# Patient Record
Sex: Female | Born: 2000 | Race: White | Hispanic: No | Marital: Single | Smoking: Never smoker
Health system: Southern US, Community
[De-identification: ages and names within clinical notes are randomized; demographics above are authoritative.]

## PROBLEM LIST (undated history)

## (undated) DIAGNOSIS — N809 Endometriosis, unspecified: Secondary | ICD-10-CM

## (undated) HISTORY — DX: Endometriosis, unspecified: N80.9

## (undated) HISTORY — PX: NO PAST SURGERIES: SHX2092

---
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
## 2020-03-16 ENCOUNTER — Other Ambulatory Visit: Payer: Self-pay

## 2020-03-16 DIAGNOSIS — Z79899 Other long term (current) drug therapy: Secondary | ICD-10-CM | POA: Diagnosis not present

## 2020-03-16 DIAGNOSIS — H1013 Acute atopic conjunctivitis, bilateral: Secondary | ICD-10-CM | POA: Insufficient documentation

## 2020-03-16 DIAGNOSIS — H5789 Other specified disorders of eye and adnexa: Secondary | ICD-10-CM | POA: Diagnosis present

## 2020-03-16 NOTE — ED Triage Notes (Signed)
Reports woke this morning with right eye "matted" shut.  Reports bilateral eye itching.

## 2020-03-17 ENCOUNTER — Emergency Department
Admission: EM | Admit: 2020-03-17 | Discharge: 2020-03-17 | Disposition: A | Discharge status: Home / Self Care | Payer: No Typology Code available for payment source | Attending: Emergency Medicine | Admitting: Emergency Medicine

## 2020-03-17 DIAGNOSIS — H1013 Acute atopic conjunctivitis, bilateral: Secondary | ICD-10-CM

## 2020-03-17 MED ORDER — HYPROMELLOSE (GONIOSCOPIC) 2.5 % OP SOLN: 1.0000 [drp] | Freq: Four times a day (QID) | OPHTHALMIC | 12 refills | Status: DC | PRN

## 2020-03-17 NOTE — ED Provider Notes (Signed)
[facility] Emergency Department Provider Note  ____________________________________________  Time seen: Approximately 2:28 AM  I have reviewed the triage vital signs and the nursing notes.   HISTORY  Chief Complaint Eye Problem   HPI Madison Fernandez is a 19 y.o. female who presents  for evaluation of watery and itchy eyes.  Patient reports that this morning she woke up with some crusting her right eye which was closed shut.  She reports that she has been tearing up bilaterally with itching for the last 24 hours.  No eye pain, no changes in vision, no foreign body sensation.  She does not use contact lenses.  No past medical history on file.  Patient Active Problem List   Diagnosis Date Noted  . Dysmenorrhea 06/23/2018  . Right lower quadrant pain 06/23/2018  . Polycystic ovaries 06/23/2018  . Endometriosis 06/23/2018    Past Surgical History:  Procedure Laterality Date  . NO PAST SURGERIES      Prior to Admission medications   Medication Sig Start Date End Date Taking? Authorizing Provider  azithromycin (ZITHROMAX Z-PAK) 250 MG tablet Take 2 tablets (500 mg) on  Day 1,  followed by 1 tablet (250 mg) once daily on Days 2 through 5. 12/02/18   Laban Emperor, PA-C  Elagolix Sodium (ORILISSA) 150 MG TABS Take 150 mg by mouth daily. 07/14/18   Defrancesco, Alanda Slim, MD  hydroxypropyl methylcellulose / hypromellose (ISOPTO TEARS / GONIOVISC) 2.5 % ophthalmic solution Place 1 drop into both eyes 4 (four) times daily as needed for dry eyes. 03/17/20   Rudene Re, MD    Allergies Patient has no known allergies.  Family History  Problem Relation Age of Onset  . Ovarian cancer Neg Hx   . Colon cancer Neg Hx   . Breast cancer Neg Hx   . Diabetes Neg Hx     Social History Social History   Tobacco Use  . Smoking status: Never Smoker  . Smokeless tobacco: Never Used  Substance Use Topics  . Alcohol use: No  . Drug use: No    Review of  Systems  Constitutional: Negative for fever. Eyes: Negative for visual changes. + eye watery and itchy ENT: Negative for sore throat. Neck: No neck pain  Cardiovascular: Negative for chest pain. Respiratory: Negative for shortness of breath. Gastrointestinal: Negative for abdominal pain, vomiting or diarrhea. Genitourinary: Negative for dysuria. Musculoskeletal: Negative for back pain. Skin: Negative for rash. Neurological: Negative for headaches, weakness or numbness. Psych: No SI or HI  ____________________________________________   PHYSICAL EXAM:  VITAL SIGNS: ED Triage Vitals  Enc Vitals Group     BP 03/16/20 2231 118/71     Pulse Rate 03/16/20 2231 98     Resp 03/16/20 2231 18     Temp 03/16/20 2231 99 F (37.2 C)     Temp Source 03/16/20 2231 Oral     SpO2 03/16/20 2231 96 %     Weight 03/16/20 2231 133 lb (60.3 kg)     Height 03/16/20 2231 5' (1.524 m)     Head Circumference --      Peak Flow --      Pain Score 03/16/20 2239 0     Pain Loc --      Pain Edu? --      Excl. in [place]? --     Constitutional: Alert and oriented. Well appearing and in no apparent distress. HEENT:      Head: Normocephalic and atraumatic.  Eyes: Conjunctivae are normal. Sclera is non-icteric.  Pupils are equal round and reactive, normal extraocular movements, normal visual fields, no discharge noticed      Mouth/Throat: Mucous membranes are moist.       Neck: Supple with no signs of meningismus. Cardiovascular: Regular rate and rhythm.  Respiratory: Normal respiratory effort.  Neurologic: Normal speech and language. Face is symmetric. Moving all extremities. No gross focal neurologic deficits are appreciated. Skin: Skin is warm, dry and intact. No rash noted. Psychiatric: Mood and affect are normal. Speech and behavior are normal.  ____________________________________________   LABS (all labs ordered are listed, but only abnormal results are displayed)  Labs Reviewed - No  data to display ____________________________________________  EKG  none  ____________________________________________  RADIOLOGY  none  ____________________________________________   PROCEDURES  Procedure(s) performed: None Procedures Critical Care performed:  None ____________________________________________   INITIAL IMPRESSION / ASSESSMENT AND PLAN / ED COURSE  19 y.o. female who presents  for evaluation of watery and itchy eyes bilaterally.  Patient is completely normal exam with normal conjunctiva, normal pupillary reflex, no obvious discharge seen, no cellulitis.  Eyes were washed with normal saline.  Patient discharged home on artificial tears with follow-up with pediatrician.  Discussed my return precautions for purulent discharge, changes in vision, eye pain, foreign body sensation, or injected conjunctiva.  Old medical records reviewed    _____________________________________________ Please note:  Patient was evaluated in Emergency Department today for the symptoms described in the history of present illness. Patient was evaluated in the context of the global COVID-19 pandemic, which necessitated consideration that the patient might be at risk for infection with the SARS-CoV-2 virus that causes COVID-19. Institutional protocols and algorithms that pertain to the evaluation of patients at risk for COVID-19 are in a state of rapid change based on information released by regulatory bodies including the CDC and federal and state organizations. These policies and algorithms were followed during the patient's care in the ED.  Some ED evaluations and interventions may be delayed as a result of limited staffing during the pandemic.   [place] Controlled Substance Database was reviewed by me. ____________________________________________   FINAL CLINICAL IMPRESSION(S) / ED DIAGNOSES   Final diagnoses:  Allergic conjunctivitis of both eyes      NEW MEDICATIONS STARTED DURING  THIS VISIT:  ED Discharge Orders         Ordered    hydroxypropyl methylcellulose / hypromellose (ISOPTO TEARS / GONIOVISC) 2.5 % ophthalmic solution  4 times daily PRN     03/17/20 0228           Note:  This document was prepared using Dragon voice recognition software and may include unintentional dictation errors.    Don Perking, [place], MD 03/17/20 [phone]

## 2020-08-12 ENCOUNTER — Other Ambulatory Visit: Payer: Self-pay

## 2020-08-12 ENCOUNTER — Ambulatory Visit (INDEPENDENT_AMBULATORY_CARE_PROVIDER_SITE_OTHER): Payer: No Typology Code available for payment source | Admitting: Certified Nurse Midwife

## 2020-08-12 ENCOUNTER — Encounter: Payer: Self-pay | Admitting: Certified Nurse Midwife

## 2020-08-12 VITALS — BP 95/58 | HR 91 | Ht 59.0 in | Wt 138.6 lb

## 2020-08-12 DIAGNOSIS — N941 Unspecified dyspareunia: Secondary | ICD-10-CM

## 2020-08-12 DIAGNOSIS — Z79899 Other long term (current) drug therapy: Secondary | ICD-10-CM

## 2020-08-12 NOTE — Patient Instructions (Signed)
Endometriosis  Endometriosis is a condition in which the tissue that lines the uterus (endometrium) grows outside of its normal location. The tissue may grow in many locations close to the uterus, but it commonly grows on the ovaries, fallopian tubes, vagina, or bowel. When the uterus sheds the endometrium every menstrual cycle, there is bleeding wherever the endometrial tissue is located. This can cause pain because blood is irritating to tissues that are not normally exposed to it. What are the causes? The cause of endometriosis is not known. What increases the risk? You may be more likely to develop endometriosis if you:  Have a family history of endometriosis.  Have never given birth.  Started your period at age 9 or younger.  Have high levels of estrogen in your body.  Were exposed to a certain medicine (diethylstilbestrol) before you were born (in utero).  Had low birth weight.  Were born as a twin, triplet, or other multiple.  Have a BMI of less than 25. BMI is an estimate of body fat and is calculated from height and weight. What are the signs or symptoms? Often, there are no symptoms of this condition. If you do have symptoms, they may:  Vary depending on where your endometrial tissue is growing.  Occur during your menstrual period (most common) or midcycle.  Come and go, or you may go months with no symptoms at all.  Stop with menopause. Symptoms may include:  Pain in the back or abdomen.  Heavier bleeding during periods.  Pain during sex.  Painful bowel movements.  Infertility.  Pelvic pain.  Bleeding more than once a month. How is this diagnosed? This condition is diagnosed based on your symptoms and a physical exam. You may have tests, such as:  Blood tests and urine tests. These may be done to help rule out other possible causes of your symptoms.  Ultrasound, to look for abnormal tissues.  An X-ray of the lower bowel (barium enema).  An  ultrasound that is done through the vagina (transvaginally).  CT scan.  MRI.  Laparoscopy. In this procedure, a lighted, pencil-sized instrument called a laparoscope is inserted into your abdomen through an incision. The laparoscope allows your health care provider to look at the organs inside your body and check for abnormal tissue to confirm the diagnosis. If abnormal tissue is found, your health care provider may remove a small piece of tissue (biopsy) to be examined under a microscope. How is this treated? Treatment for this condition may include:  Medicines to relieve pain, such as NSAIDs.  Hormone therapy. This involves using artificial (synthetic) hormones to reduce endometrial tissue growth. Your health care provider may recommend using a hormonal form of birth control, or other medicines.  Surgery. This may be done to remove abnormal endometrial tissue. ? In some cases, tissue may be removed using a laparoscope and a laser (laparoscopic laser treatment). ? In severe cases, surgery may be done to remove the fallopian tubes, uterus, and ovaries (hysterectomy). Follow these instructions at home:  Take over-the-counter and prescription medicines only as told by your health care provider.  Do not drive or use heavy machinery while taking prescription pain medicine.  Try to avoid activities that cause pain, including sexual activity.  Keep all follow-up visits as told by your health care provider. This is important. Contact a health care provider if:  You have pain in the area between your hip bones (pelvic area) that occurs: ? Before, during, or after your period. ?   In between your period and gets worse during your period. ? During or after sex. ? With bowel movements or urination, especially during your period.  You have problems getting pregnant.  You have a fever. Get help right away if:  You have severe pain that does not get better with medicine.  You have severe  nausea and vomiting, or you cannot eat without vomiting.  You have pain that affects only the lower, right side of your abdomen.  You have abdominal pain that gets worse.  You have abdominal swelling.  You have blood in your stool. This information is not intended to replace advice given to you by your health care provider. Make sure you discuss any questions you have with your health care provider. Document Revised: 09/24/2017 Document Reviewed: 03/14/2016 Elsevier Patient Education  2020 [employer] Inc.  

## 2020-08-12 NOTE — Progress Notes (Signed)
GYN ENCOUNTER NOTE  Subjective:       Madison Fernandez is a 19 y.o. G0P0000 female is here for gynecologic evaluation of the following issues:  1.  Endometriosis follow up, pt state she was was perscribed Pollie Friar for endometriosis and that she is here for a refill. .     Gynecologic History Patient's last menstrual period was 07/12/2020 (exact date). Contraception: Nexplanon, expidred Last Pap: n/a . Last mammogram: n/a  Obstetric History OB History  Gravida Para Term Preterm AB Living  0 0 0 0 0 0  SAB TAB Ectopic Multiple Live Births  0 0 0 0 0    Past Medical History:  Diagnosis Date  . Endometriosis     Past Surgical History:  Procedure Laterality Date  . NO PAST SURGERIES      Current Outpatient Medications on File Prior to Visit  Medication Sig Dispense Refill  . sertraline (ZOLOFT) 50 MG tablet Take 50 mg by mouth daily.    . Elagolix Sodium (ORILISSA) 150 MG TABS Take 150 mg by mouth daily. (Patient not taking: Reported on 08/12/2020) 30 tablet 6   No current facility-administered medications on file prior to visit.    No Known Allergies  Social History   Socioeconomic History  . Marital status: Single    Spouse name: Not on file  . Number of children: Not on file  . Years of education: Not on file  . Highest education level: Not on file  Occupational History  . Not on file  Tobacco Use  . Smoking status: Never Smoker  . Smokeless tobacco: Never Used  Vaping Use  . Vaping Use: Never used  Substance and Sexual Activity  . Alcohol use: No  . Drug use: No  . Sexual activity: Not Currently    Birth control/protection: Implant  Other Topics Concern  . Not on file  Social History Narrative  . Not on file   Social Determinants of Health   Financial Resource Strain:   . Difficulty of Paying Living Expenses: Not on file  Food Insecurity:   . Worried About Programme researcher, broadcasting/film/video in the Last Year: Not on file  . Ran Out of Food in the Last Year:  Not on file  Transportation Needs:   . Lack of Transportation (Medical): Not on file  . Lack of Transportation (Non-Medical): Not on file  Physical Activity:   . Days of Exercise per Week: Not on file  . Minutes of Exercise per Session: Not on file  Stress:   . Feeling of Stress : Not on file  Social Connections:   . Frequency of Communication with Friends and Family: Not on file  . Frequency of Social Gatherings with Friends and Family: Not on file  . Attends Religious Services: Not on file  . Active Member of Clubs or Organizations: Not on file  . Attends Banker Meetings: Not on file  . Marital Status: Not on file  Intimate Partner Violence:   . Fear of Current or Ex-Partner: Not on file  . Emotionally Abused: Not on file  . Physically Abused: Not on file  . Sexually Abused: Not on file    Family History  Problem Relation Age of Onset  . Ovarian cancer Neg Hx   . Colon cancer Neg Hx   . Breast cancer Neg Hx   . Diabetes Neg Hx     The following portions of the patient's history were reviewed and updated as appropriate: allergies,  current medications, past family history, past medical history, past social history, past surgical history and problem list.  Review of Systems Review of Systems - Negative except as mentioned in HPI Review of Systems - General ROS: negative for - chills, fatigue, fever, hot flashes, malaise or night sweats Hematological and Lymphatic ROS: negative for - bleeding problems or swollen lymph nodes Gastrointestinal ROS: negative for - abdominal pain, blood in stools, change in bowel habits and nausea/vomiting Musculoskeletal ROS: negative for - joint pain, muscle pain or muscular weakness Genito-Urinary ROS: negative for - change in menstrual cycle, dysmenorrhea, dyspareunia, dysuria, genital discharge, genital ulcers, hematuria, incontinence, irregular/heavy menses, nocturia or pelvic pain  Objective:   BP (!) 95/58   Pulse 91   Ht  4\' 11"  (1.499 m)   Wt 138 lb 9 oz (62.9 kg)   LMP 07/12/2020 (Exact Date)   BMI 27.99 kg/m  CONSTITUTIONAL: Well-developed, well-nourished female in no acute distress.  HENT:  Normocephalic, atraumatic.  NECK: Normal range of motion, supple, no masses.  Normal thyroid.  SKIN: Skin is warm and dry. No rash noted. Not diaphoretic. No erythema. No pallor. NEUROLGIC: Alert and oriented to person, place, and time. PSYCHIATRIC: Normal mood and affect. Normal behavior. Normal judgment and thought content. CARDIOVASCULAR:Not Examined RESPIRATORY: Not Examined BREASTS: Not Examined ABDOMEN: Soft, non distended; Non tender.  No Organomegaly. PELVIC:not indicated for medication refill.  MUSCULOSKELETAL: Normal range of motion. No tenderness.  No cyanosis, clubbing, or edema.     Assessment:   Endometriosis Medication management     Plan:   Discussed 07/14/2020 intended use for endometriosis : Maximum treatment duration: 24 months. Pt verbalizes understanding. Discussed use of BC and motrin to manage pian . She verbalizes and agrees to plan. She will return to clinic for removal of expired nexplnon and counseling for new birth control method. She agrees to plan  Of care.   Face to face time 15  Pollie Friar, CNM

## 2020-08-27 ENCOUNTER — Ambulatory Visit: Payer: Self-pay | Admitting: Certified Nurse Midwife

## 2020-10-26 DIAGNOSIS — R7303 Prediabetes: Secondary | ICD-10-CM

## 2020-10-26 HISTORY — DX: Prediabetes: R73.03

## 2020-11-25 ENCOUNTER — Other Ambulatory Visit: Payer: Self-pay

## 2020-11-25 ENCOUNTER — Emergency Department
Admission: EM | Admit: 2020-11-25 | Discharge: 2020-11-25 | Disposition: A | Discharge status: Home / Self Care | Payer: Self-pay | Attending: Emergency Medicine | Admitting: Emergency Medicine

## 2020-11-25 ENCOUNTER — Emergency Department: Payer: Self-pay

## 2020-11-25 DIAGNOSIS — R110 Nausea: Secondary | ICD-10-CM | POA: Insufficient documentation

## 2020-11-25 DIAGNOSIS — N390 Urinary tract infection, site not specified: Secondary | ICD-10-CM | POA: Insufficient documentation

## 2020-11-25 DIAGNOSIS — B9689 Other specified bacterial agents as the cause of diseases classified elsewhere: Secondary | ICD-10-CM | POA: Insufficient documentation

## 2020-11-25 DIAGNOSIS — R1031 Right lower quadrant pain: Secondary | ICD-10-CM | POA: Insufficient documentation

## 2020-11-25 LAB — CBC
HCT: 42.4 % (ref 36.0–46.0)
Hemoglobin: 14 g/dL (ref 12.0–15.0)
MCH: 29.2 pg (ref 26.0–34.0)
MCHC: 33 g/dL (ref 30.0–36.0)
MCV: 88.5 fL (ref 80.0–100.0)
Platelets: 328 10*3/uL (ref 150–400)
RBC: 4.79 MIL/uL (ref 3.87–5.11)
RDW: 12.2 % (ref 11.5–15.5)
WBC: 13.7 10*3/uL — ABNORMAL HIGH (ref 4.0–10.5)
nRBC: 0 % (ref 0.0–0.2)

## 2020-11-25 LAB — URINALYSIS, COMPLETE (UACMP) WITH MICROSCOPIC
Bilirubin Urine: NEGATIVE
Glucose, UA: 50 mg/dL — AB
Ketones, ur: NEGATIVE mg/dL
Nitrite: NEGATIVE
Protein, ur: 100 mg/dL — AB
RBC / HPF: >50 RBC/hpf — ABNORMAL HIGH (ref 0–5)
Specific Gravity, Urine: 1.032 — ABNORMAL HIGH (ref 1.005–1.030)
pH: 5 (ref 5.0–8.0)

## 2020-11-25 LAB — BASIC METABOLIC PANEL
Anion gap: 12 (ref 5–15)
BUN: 12 mg/dL (ref 6–20)
CO2: 21 mmol/L — ABNORMAL LOW (ref 22–32)
Calcium: 9.5 mg/dL (ref 8.9–10.3)
Chloride: 108 mmol/L (ref 98–111)
Creatinine, Ser: 0.7 mg/dL (ref 0.44–1.00)
GFR, Estimated: >60 mL/min (ref >60)
Glucose, Bld: 123 mg/dL — ABNORMAL HIGH (ref 70–99)
Potassium: 3.8 mmol/L (ref 3.5–5.1)
Sodium: 141 mmol/L (ref 135–145)

## 2020-11-25 LAB — WET PREP, GENITAL
Clue Cells Wet Prep HPF POC: NONE SEEN
Sperm: NONE SEEN
Trich, Wet Prep: NONE SEEN
Yeast Wet Prep HPF POC: NONE SEEN

## 2020-11-25 LAB — PREGNANCY, URINE: Preg Test, Ur: NEGATIVE

## 2020-11-25 MED ORDER — NAPROXEN 500 MG PO TABS: 500.0000 mg | ORAL_TABLET | Freq: Two times a day (BID) | ORAL | Status: DC

## 2020-11-25 MED ORDER — PHENAZOPYRIDINE HCL 200 MG PO TABS: 200.0000 mg | ORAL_TABLET | Freq: Three times a day (TID) | ORAL | 0 refills | Status: DC | PRN

## 2020-11-25 MED ORDER — SULFAMETHOXAZOLE-TRIMETHOPRIM 800-160 MG PO TABS: 1.0000 | ORAL_TABLET | Freq: Two times a day (BID) | ORAL | 0 refills | Status: DC

## 2020-11-25 NOTE — ED Triage Notes (Signed)
Pt states coming in for pain that started "in my private area but is now in my back and my abdomen." Pt states that she had 1 episode of vomiting. Pt states pain is on the right side. Pt states some burning with urination.

## 2020-11-25 NOTE — ED Notes (Signed)
Patient transported to CT 

## 2020-11-25 NOTE — ED Notes (Signed)
Verified with provider that we did not need an EKG

## 2020-11-25 NOTE — ED Provider Notes (Signed)
[facility] Emergency Department Provider Note   ____________________________________________   Event Date/Time   First MD Initiated Contact with Patient 11/25/20 [phone]     (approximate)  I have reviewed the triage vital signs and the nursing notes.   HISTORY  Chief Complaint Flank Pain (Flank/back pain)    HPI Madison Fernandez is a 20 y.o. female patient presents with 3 days of vaginal discharge. Patient also complaining of nausea,, dysuria and right flank pain. Patient denies fever with complaint. Patient last sexual contact was greater than 4 months ago. Patient stated abnormal periods secondary to birth control implants. Patient rates pain as a 10/10. Patient described the pain as "burning/aching". No palliative measure for complaint. History endometriosis and polycystic ovaries.      Past Medical History:  Diagnosis Date  . Endometriosis     Patient Active Problem List   Diagnosis Date Noted  . Dyspareunia, female 08/12/2020  . Dysmenorrhea 06/23/2018  . Polycystic ovaries 06/23/2018  . Endometriosis 06/23/2018    Past Surgical History:  Procedure Laterality Date  . NO PAST SURGERIES      Prior to Admission medications   Medication Sig Start Date End Date Taking? Authorizing Provider  naproxen (NAPROSYN) 500 MG tablet Take 1 tablet (500 mg total) by mouth 2 (two) times daily with a meal. 11/25/20  Yes Joni Reining, PA-C  phenazopyridine (PYRIDIUM) 200 MG tablet Take 1 tablet (200 mg total) by mouth 3 (three) times daily as needed for pain. 11/25/20  Yes Joni Reining, PA-C  sulfamethoxazole-trimethoprim (BACTRIM DS) 800-160 MG tablet Take 1 tablet by mouth 2 (two) times daily. 11/25/20  Yes Joni Reining, PA-C  Elagolix Sodium (ORILISSA) 150 MG TABS Take 150 mg by mouth daily. Patient not taking: Reported on 08/12/2020 07/14/18   Defrancesco, Prentice Docker, MD  sertraline (ZOLOFT) 50 MG tablet Take 50 mg by mouth daily. 05/10/20    [provider]    Allergies Patient has no known allergies.  Family History  Problem Relation Age of Onset  . Ovarian cancer Neg Hx   . Colon cancer Neg Hx   . Breast cancer Neg Hx   . Diabetes Neg Hx     Social History Social History   Tobacco Use  . Smoking status: Never Smoker  . Smokeless tobacco: Never Used  Vaping Use  . Vaping Use: Never used  Substance Use Topics  . Alcohol use: No  . Drug use: No    Review of Systems Constitutional: No fever/chills Eyes: No visual changes. ENT: No sore throat. Cardiovascular: Denies chest pain. Respiratory: Denies shortness of breath. Gastrointestinal: No abdominal pain.  No nausea, no vomiting.  No diarrhea.  No constipation. Genitourinary: Positive for dysuria. Musculoskeletal: Negative for back pain. Right flank pain. Skin: Negative for rash. Neurological: Negative for headaches, focal weakness or numbness.   ____________________________________________   PHYSICAL EXAM:  VITAL SIGNS: ED Triage Vitals  Enc Vitals Group     BP 11/25/20 0125 106/90     Pulse Rate 11/25/20 0125 82     Resp 11/25/20 0125 (!) 22     Temp 11/25/20 0125 97.9 F (36.6 C)     Temp Source 11/25/20 0250 Oral     SpO2 11/25/20 0125 96 %     Weight 11/25/20 0126 140 lb (63.5 kg)     Height 11/25/20 0126 4\' 11"  (1.499 m)     Head Circumference --      Peak Flow --  Pain Score 11/25/20 0125 10     Pain Loc --      Pain Edu? --      Excl. in GC? --    Constitutional: Alert and oriented. Well appearing and in no acute distress. Cardiovascular: Normal rate, regular rhythm. Grossly normal heart sounds.  Good peripheral circulation. Respiratory: Normal respiratory effort.  No retractions. Lungs CTAB. Gastrointestinal: Soft and nontender. No distention. No abdominal bruits. Right CVA tenderness. Genitourinary: Chaperoned pelvic exam reveals no acute findings.  Culture is pending. Neurologic:  Normal speech and language. No  gross focal neurologic deficits are appreciated. No gait instability. Skin:  Skin is warm, dry and intact. No rash noted. Psychiatric: Mood and affect are normal. Speech and behavior are normal.  ____________________________________________   LABS (all labs ordered are listed, but only abnormal results are displayed)  Labs Reviewed  WET PREP, GENITAL - Abnormal; Notable for the following components:      Result Value   WBC, Wet Prep HPF POC FEW (*)    All other components within normal limits  URINALYSIS, COMPLETE (UACMP) WITH MICROSCOPIC - Abnormal; Notable for the following components:   Color, Urine YELLOW (*)    APPearance CLOUDY (*)    Specific Gravity, Urine 1.032 (*)    Glucose, UA 50 (*)    Hgb urine dipstick LARGE (*)    Protein, ur 100 (*)    Leukocytes,Ua TRACE (*)    RBC / HPF >50 (*)    Bacteria, UA RARE (*)    All other components within normal limits  BASIC METABOLIC PANEL - Abnormal; Notable for the following components:   CO2 21 (*)    Glucose, Bld 123 (*)    All other components within normal limits  CBC - Abnormal; Notable for the following components:   WBC 13.7 (*)    All other components within normal limits  URINE CULTURE  PREGNANCY, URINE  POC URINE PREG, ED   ____________________________________________  EKG   ____________________________________________  RADIOLOGY I, Joni Reining, personally viewed and evaluated these images (plain radiographs) as part of my medical decision making, as well as reviewing the written report by the radiologist.  ED MD interpretation:    Official radiology report(s): CT Renal Stone Study  Result Date: 11/25/2020 CLINICAL DATA:  Back and abdominal pain, vomiting, right-sided, burning with urination EXAM: CT ABDOMEN AND PELVIS WITHOUT CONTRAST TECHNIQUE: Multidetector CT imaging of the abdomen and pelvis was performed following the standard protocol without IV contrast. COMPARISON:  06/22/2018 FINDINGS: Lower  chest: No acute abnormality. Hepatobiliary: No solid liver abnormality is seen. No gallstones, gallbladder wall thickening, or biliary dilatation. Pancreas: Unremarkable. No pancreatic ductal dilatation or surrounding inflammatory changes. Spleen: Normal in size without significant abnormality. Adrenals/Urinary Tract: Adrenal glands are unremarkable. Kidneys are normal, without renal calculi, solid lesion, or hydronephrosis. Bladder is unremarkable. Incidental note of a small urachal remnant. Stomach/Bowel: Stomach is within normal limits. Appendix appears normal. No evidence of bowel wall thickening, distention, or inflammatory changes. Vascular/Lymphatic: No significant vascular findings are present. No enlarged abdominal or pelvic lymph nodes. Reproductive: No mass or other significant abnormality. Other: No abdominal wall hernia or abnormality. No abdominopelvic ascites. Musculoskeletal: No acute or significant osseous findings. IMPRESSION: No non-contrast CT findings of the abdomen or pelvis to explain pain, nausea, or vomiting. No evidence of urinary tract calculus or hydronephrosis. Electronically Signed   By: Lauralyn Primes M.D.   On: 11/25/2020 10:32    ____________________________________________   PROCEDURES  Procedure(s)  performed (including Critical Care):  Procedures   ____________________________________________   INITIAL IMPRESSION / ASSESSMENT AND PLAN / ED COURSE  As part of my medical decision making, I reviewed the following data within the electronic MEDICAL RECORD NUMBER         Patient complaint right flank and right lower abdominal pain for 2 days.  Patient states dysuria.  Discussed patient lab results.  Discussed no acute findings on CT.  Patient complaining physical exam consistent with UTI.  Culture is pending.  Patient given discharge care instruction advised take medication as directed.  Patient [facility]  Return to ED if condition  worsens.      ____________________________________________   FINAL CLINICAL IMPRESSION(S) / ED DIAGNOSES  Final diagnoses:  Lower urinary tract infectious disease     ED Discharge Orders         Ordered    sulfamethoxazole-trimethoprim (BACTRIM DS) 800-160 MG tablet  2 times daily        11/25/20 1119    phenazopyridine (PYRIDIUM) 200 MG tablet  3 times daily PRN        11/25/20 1119    naproxen (NAPROSYN) 500 MG tablet  2 times daily with meals        11/25/20 1119          *Please note:  Madison Fernandez was evaluated in Emergency Department on 11/25/2020 for the symptoms described in the history of present illness. She was evaluated in the context of the global COVID-19 pandemic, which necessitated consideration that the patient might be at risk for infection with the SARS-CoV-2 virus that causes COVID-19. Institutional protocols and algorithms that pertain to the evaluation of patients at risk for COVID-19 are in a state of rapid change based on information released by regulatory bodies including the CDC and federal and state organizations. These policies and algorithms were followed during the patient's care in the ED.  Some ED evaluations and interventions may be delayed as a result of limited staffing during and the pandemic.*   Note:  This document was prepared using Dragon voice recognition software and may include unintentional dictation errors.    Joni Reining, PA-C 11/25/20 1124    Phineas Semen, MD 11/29/20 [phone]

## 2020-11-26 LAB — URINE CULTURE
Culture: <10000 — AB
Special Requests: NORMAL

## 2021-02-20 ENCOUNTER — Other Ambulatory Visit: Payer: Self-pay

## 2021-02-20 ENCOUNTER — Ambulatory Visit: Payer: Medicaid Other | Admitting: Gerontology

## 2021-02-20 ENCOUNTER — Other Ambulatory Visit: Payer: Medicaid Other

## 2021-02-20 VITALS — BP 109/73 | HR 86 | Temp 98.0°F | Ht 59.0 in | Wt 144.8 lb

## 2021-02-20 DIAGNOSIS — Z7689 Persons encountering health services in other specified circumstances: Secondary | ICD-10-CM

## 2021-02-20 NOTE — Patient Instructions (Signed)
https://www.nimh.nih.gov/health/publications/depression-what-you-need-to-know/index.shtml">  Depression Screening Depression screening is a tool that your health care provider can use to learn if you have symptoms of depression. Depression is a common condition with many symptoms that are also often found in other conditions. Depression is treatable, but it must first be diagnosed. You may not know that certain feelings, thoughts, and behaviors that you are having can be symptoms of depression. Taking a depression screening test can help you and your health care provider decide if you need more assessment, or if you should be referred to a mental health care provider. What are the screening tests?  You may have a physical exam to see if another condition is affecting your mental health. You may have a blood or urine sample taken during the physical exam.  You may be interviewed using a screening tool that was developed from research, such as one of these: ? Patient Health Questionnaire (PHQ). This is a set of either 2 or 9 questions. A health care provider who has been trained to score this screening test uses a guide to assess if your symptoms suggest that you may have depression. ? Hamilton Depression Rating Scale (HAM-D). This is a set of either 17 or 24 questions. You may be asked to take it again during or after your treatment, to see if your depression has gotten better. ? Beck Depression Inventory (BDI). This is a set of 21 multiple choice questions. Your health care provider scores your answers to assess:  Your level of depression, ranging from mild to severe.  Your response to treatment.  Your health care provider may talk with you about your daily activities, such as eating, sleeping, work, and recreation, and ask if you have had any changes in activity.  Your health care provider may ask you to see a mental health specialist, such as a psychiatrist or psychologist, for more  evaluation. Who should be screened for depression?  All adults, including adults with a family history of a mental health disorder.  Adolescents who are 12-18 years old.  People who are recovering from a myocardial infarction (MI).  Pregnant women, or women who have given birth.  People who have a long-term (chronic) illness.  Anyone who has been diagnosed with another type of a mental health disorder.  Anyone who has symptoms that could show depression.   What do my results mean? Your health care provider will review the results of your depression screening, physical exam, and lab tests. Positive screens suggest that you may have depression. Screening is the first step in getting the care that you may need. It is up to you to get your screening results. Ask your health care provider, or the department that is doing your screening tests, when your results will be ready. Talk with your health care provider about your results and diagnosis. A diagnosis of depression is made using the Diagnostic and Statistical Manual of Mental Disorders (DSM-V). This is a book that lists the number and type of symptoms that must be present for a health care provider to give a specific diagnosis.  Your health care provider may work with you to treat your symptoms of depression, or your health care provider may help you find a mental health provider who can assess, diagnose, and treat your depression. Get help right away if:  You have thoughts about hurting yourself or others. If you ever feel like you may hurt yourself or others, or have thoughts about taking your own life, get help   right away. You can go to your nearest emergency department or call:  Your local emergency services (911 in the [place]  A suicide crisis helpline, such as the National Suicide Prevention Lifeline at 1-800-273-8255. This is open 24 hours a day. Summary  Depression screening is the first step in getting the help that you may  need.  If your screening test shows symptoms of depression (is positive), your health care provider may ask you to see a mental health provider.  Anyone who is age 12 or older should be screened for depression. This information is not intended to replace advice given to you by your health care provider. Make sure you discuss any questions you have with your health care provider. Document Revised: 04/04/2020 Document Reviewed: 04/04/2020 Elsevier Patient Education  2021 Elsevier Inc.  

## 2021-02-20 NOTE — Progress Notes (Signed)
[facility]   Progress Note: General Provider: Wolfgang Phoenix, NP  SUBJECTIVE:   Madison Fernandez is a 20 y.o. female who  has a past medical history of Endometriosis, Endometriosis (01/2018), and Ovarian cyst (01/2018).. The patient presents to establish care with this clinic. She reports that she has a Nexplanon birth control implant in her left upper arm inserted in 2017 and was due to be removed in 2020; however, she hasn't gone back to the [facility] to replace it. She states that she will make an appointment tomorrow for  Nexplanon removal. She started her period this week and reports that she has not been sexually active for over a year. She reports that she was prescribed Zoloft for depression by [facility] pediatrics about a year ago, but she only took it for three months. She hasn't taken Zoloft in the last eight months and denies changes in sleep or appetite feeling sad hopeless, suicidal or homicidal ideation. Currently, she is not taking any prescription medications. She goes to the gym 2-3 times a week and eats a healthy diet. She never smoked and hasn't had an alcoholic drink in one year. She had her covid 19 primary vaccination series but hasn't received a booster shot. Overall she is going well and offers no further complaints. Review of Systems  Constitutional: Negative.   HENT: Negative.   Eyes: Negative.   Respiratory: Negative.   Cardiovascular: Negative.   Gastrointestinal: Negative.   Genitourinary: Negative.   Musculoskeletal: Negative.   Skin: Negative.   Neurological: Negative.   Endo/Heme/Allergies: Negative.   Psychiatric/Behavioral: Negative.      OBJECTIVE: BP 109/73 (BP Location: Left Arm, Patient Position: Sitting, Cuff Size: Normal)   Pulse 86   Temp 98 F (36.7 C)   Ht 4' 11"  (1.499 m)   Wt 144 lb 12.8 oz (65.7 kg)   SpO2 96%   BMI 29.25 kg/m   Wt Readings from Last 3 Encounters:  02/20/21 144 lb 12.8  oz (65.7 kg)  11/25/20 140 lb (63.5 kg)  08/12/20 138 lb 9 oz (62.9 kg)     Physical Exam Vitals reviewed.  HENT:     Head: Normocephalic.  Eyes:     Pupils: Pupils are equal, round, and reactive to light.  Cardiovascular:     Rate and Rhythm: Normal rate and regular rhythm.     Pulses: Normal pulses.     Heart sounds: Normal heart sounds.  Pulmonary:     Effort: Pulmonary effort is normal.     Breath sounds: Normal breath sounds.  Abdominal:     General: Bowel sounds are normal.     Palpations: Abdomen is soft.  Musculoskeletal:        General: Normal range of motion.     Cervical back: Normal range of motion.  Skin:    General: Skin is warm and dry.     Capillary Refill: Capillary refill takes less than 2 seconds.  Neurological:     Mental Status: She is alert and oriented to person, place, and time.  Psychiatric:        Mood and Affect: Mood normal.        Behavior: Behavior normal.        Thought Content: Thought content normal.        Judgment: Judgment normal.     ASSESSMENT/PLAN:   1. Encounter to establish care - Advised to return to [facility] for birth control options. -  CBC - Comp Met (CMET) - Lipid Panel With LDL/HDL Ratio - TSH - HgB A1c   Return in about 2 weeks (around 03/06/2021), or if symptoms worsen or fail to improve, for lab review. .    The patient was given clear instructions to go to ER or return to medical center if symptoms do not improve, worsen or new problems develop. The patient verbalized understanding and agreed with plan of care.  Anjanae Woehrle, [place]

## 2021-02-21 LAB — LIPID PANEL WITH LDL/HDL RATIO
Cholesterol, Total: 166 mg/dL (ref 100–199)
HDL: 50 mg/dL (ref >39)
LDL Chol Calc (NIH): 97 mg/dL (ref 0–99)
LDL/HDL Ratio: 1.9 ratio (ref 0.0–3.2)
Triglycerides: 106 mg/dL (ref 0–149)
VLDL Cholesterol Cal: 19 mg/dL (ref 5–40)

## 2021-02-21 LAB — COMPREHENSIVE METABOLIC PANEL
ALT: 68 IU/L — ABNORMAL HIGH (ref 0–32)
AST: 37 IU/L (ref 0–40)
Albumin/Globulin Ratio: 1.8 (ref 1.2–2.2)
Albumin: 4.8 g/dL (ref 3.9–5.0)
Alkaline Phosphatase: 123 IU/L — ABNORMAL HIGH (ref 44–121)
BUN/Creatinine Ratio: 9 (ref 9–23)
BUN: 6 mg/dL (ref 6–20)
Bilirubin Total: 0.4 mg/dL (ref 0.0–1.2)
CO2: 20 mmol/L (ref 20–29)
Calcium: 9.8 mg/dL (ref 8.7–10.2)
Chloride: 104 mmol/L (ref 96–106)
Creatinine, Ser: 0.64 mg/dL (ref 0.57–1.00)
Globulin, Total: 2.7 g/dL (ref 1.5–4.5)
Glucose: 88 mg/dL (ref 65–99)
Potassium: 4.6 mmol/L (ref 3.5–5.2)
Sodium: 144 mmol/L (ref 134–144)
Total Protein: 7.5 g/dL (ref 6.0–8.5)
eGFR: 129 mL/min/{1.73_m2} (ref >59)

## 2021-02-21 LAB — HEMOGLOBIN A1C
Est. average glucose Bld gHb Est-mCnc: 126 mg/dL
Hgb A1c MFr Bld: 6 % — ABNORMAL HIGH (ref 4.8–5.6)

## 2021-02-21 LAB — TSH: TSH: 1.04 u[IU]/mL (ref 0.450–4.500)

## 2021-03-06 ENCOUNTER — Other Ambulatory Visit: Payer: Self-pay

## 2021-03-06 ENCOUNTER — Ambulatory Visit: Payer: Medicaid Other | Admitting: Gerontology

## 2021-03-06 VITALS — BP 105/71 | HR 106 | Temp 98.1°F | Ht 59.0 in | Wt 146.4 lb

## 2021-03-06 DIAGNOSIS — R748 Abnormal levels of other serum enzymes: Secondary | ICD-10-CM

## 2021-03-06 DIAGNOSIS — R059 Cough, unspecified: Secondary | ICD-10-CM

## 2021-03-06 DIAGNOSIS — R7303 Prediabetes: Secondary | ICD-10-CM

## 2021-03-06 MED ORDER — METFORMIN HCL 1000 MG PO TABS
500.0000 mg | ORAL_TABLET | Freq: Every day | ORAL | 0 refills | Status: DC
  Filled 2021-03-06: qty 15, 30d supply, fill #0
  Filled 2021-04-10: qty 15, 30d supply, fill #1

## 2021-03-06 MED ORDER — BENZONATATE 100 MG PO CAPS
100.0000 mg | ORAL_CAPSULE | Freq: Three times a day (TID) | ORAL | 0 refills | Status: DC | PRN
  Filled 2021-03-06: qty 20, 7d supply, fill #0

## 2021-03-06 NOTE — Progress Notes (Signed)
Established Patient Office Visit  Subjective:  Patient ID: Madison Fernandez, female    DOB: 07/05/2001  Age: 20 y.o. MRN: [id number]  CC:  Chief Complaint  Patient presents with  . Follow-up    2 week check up; sick in Jan, has had a cough ever since then that is worse at night. Vomitting last night; wants to discuss anxiety medications because she ran out in 2021; thinks she has IBS    HPI Madison Fernandez is a 20 y.o. female who  has a past medical history of Endometriosis (01/2018), and Ovarian cyst (01/2018),presents for lab review. Her HgbA1c done on 02/20/21 was 6%, she reports having a family history of type 2 diabetes. Her Alkaline Phosphatase was slightly elevated and ALT was 68. She denies alcohol consumption, denies jaundice, dark urine and right upper quadrant pain. She c/o chronic dry cough that has been going on since January. She states that cough wakes her up at night, denies shortness of breath, wheezing, chest tightness and rhinorrhea. She also reports that she is yet to follow up with the health department for the removal of left upper arm Nexplanon. She states that her mood is good, denies suicidal nor homicidal ideation. Overall, she states that she's doing well and offers no further complaint.                             Past Medical History:  Diagnosis Date  . Endometriosis   . Endometriosis 01/2018  . Ovarian cyst 01/2018    Past Surgical History:  Procedure Laterality Date  . NO PAST SURGERIES      Family History  Problem Relation Age of Onset  . Ovarian cancer Neg Hx   . Colon cancer Neg Hx   . Breast cancer Neg Hx   . Diabetes Neg Hx     Social History   Socioeconomic History  . Marital status: Single    Spouse name: Not on file  . Number of children: Not on file  . Years of education: Not on file  . Highest education level: Not on file  Occupational History  . Not on file  Tobacco Use  . Smoking status: Never Smoker  . Smokeless  tobacco: Never Used  Vaping Use  . Vaping Use: Never used  Substance and Sexual Activity  . Alcohol use: No  . Drug use: No  . Sexual activity: Not Currently    Birth control/protection: Implant  Other Topics Concern  . Not on file  Social History Narrative  . Not on file   Social Determinants of Health   Financial Resource Strain: Not on file  Food Insecurity: No Food Insecurity  . Worried About Charity fundraiser in the Last Year: Never true  . Ran Out of Food in the Last Year: Never true  Transportation Needs: No Transportation Needs  . Lack of Transportation (Medical): No  . Lack of Transportation (Non-Medical): No  Physical Activity: Not on file  Stress: Not on file  Social Connections: Not on file  Intimate Partner Violence: Not on file    Outpatient Medications Prior to Visit  Medication Sig Dispense Refill  . Elagolix Sodium (ORILISSA) 150 MG TABS Take 150 mg by mouth daily. (Patient not taking: No sig reported) 30 tablet 6  . naproxen (NAPROSYN) 500 MG tablet Take 1 tablet (500 mg total) by mouth 2 (two) times daily with a meal. (Patient not taking: No  sig reported) 20 tablet 00  . sertraline (ZOLOFT) 50 MG tablet Take 50 mg by mouth daily. (Patient not taking: No sig reported)     No facility-administered medications prior to visit.    Allergies  Allergen Reactions  . Sulfa Antibiotics Rash    ROS Review of Systems  Constitutional: Negative.   Respiratory: Positive for cough.   Cardiovascular: Negative.   Skin: Negative.   Neurological: Negative.   Psychiatric/Behavioral: Negative.       Objective:    Physical Exam HENT:     Head: Normocephalic and atraumatic.  Cardiovascular:     Rate and Rhythm: Normal rate and regular rhythm.     Pulses: Normal pulses.     Heart sounds: Normal heart sounds.  Pulmonary:     Effort: Pulmonary effort is normal.     Breath sounds: Normal breath sounds.  Skin:    General: Skin is warm.  Neurological:      General: No focal deficit present.     Mental Status: She is alert and oriented to person, place, and time. Mental status is at baseline.  Psychiatric:        Mood and Affect: Mood normal.        Behavior: Behavior normal.        Thought Content: Thought content normal.        Judgment: Judgment normal.     BP 105/71   Pulse (!) 106   Temp 98.1 F (36.7 C)   Ht 4' 11"  (1.499 m)   Wt 146 lb 6.4 oz (66.4 kg)   SpO2 97%   BMI 29.57 kg/m  Wt Readings from Last 3 Encounters:  03/06/21 146 lb 6.4 oz (66.4 kg)  02/20/21 144 lb 12.8 oz (65.7 kg)  11/25/20 140 lb (63.5 kg)     Health Maintenance Due  Topic Date Due  . HPV VACCINES (1 - 2-dose series) Never done  . PAP-Cervical Cytology Screening  Never done  . PAP SMEAR-Modifier  Never done       Topic Date Due  . HPV VACCINES (1 - 2-dose series) Never done    Lab Results  Component Value Date   TSH 1.040 02/20/2021   Lab Results  Component Value Date   WBC 13.7 (H) 11/25/2020   HGB 14.0 11/25/2020   HCT 42.4 11/25/2020   MCV 88.5 11/25/2020   PLT 328 11/25/2020   Lab Results  Component Value Date   NA 144 02/20/2021   K 4.6 02/20/2021   CO2 20 02/20/2021   GLUCOSE 88 02/20/2021   BUN 6 02/20/2021   CREATININE 0.64 02/20/2021   BILITOT 0.4 02/20/2021   ALKPHOS 123 (H) 02/20/2021   AST 37 02/20/2021   ALT 68 (H) 02/20/2021   PROT 7.5 02/20/2021   ALBUMIN 4.8 02/20/2021   CALCIUM 9.8 02/20/2021   ANIONGAP 12 11/25/2020   EGFR 129 02/20/2021   Lab Results  Component Value Date   CHOL 166 02/20/2021   Lab Results  Component Value Date   HDL 50 02/20/2021   Lab Results  Component Value Date   LDLCALC 97 02/20/2021   Lab Results  Component Value Date   TRIG 106 02/20/2021   No results found for: [facility] Lab Results  Component Value Date   HGBA1C 6.0 (H) 02/20/2021      Assessment & Plan:    1. Prediabetes -Her HgbA1c was 6%, she will continue on Metformin, educated on medication side  effects and advised to notify  clinic. Continue on low carb/non concentrated sweet diet and exercise as tolerated. - metFORMIN (GLUCOPHAGE) 1000 MG tablet; Take 0.5 tablet (500 mg total) by mouth daily with breakfast.  Dispense: 30 tablet; Refill: 0  2. Elevated liver enzymes - Her ALT was elevated, will check Hepatitis panel and GGT. - Hepatitis, Acute; Future - Gamma GT; Future - Gamma GT - Hepatitis, Acute  3. Cough - She will continue on Benzonatate, educated them on side effects and advised to notify the clinic, increase water intake and notify clinic for worsening symptoms. - benzonatate (TESSALON PERLES) 100 MG capsule; Take 1 capsule (100 mg total) by mouth 3 (three) times daily as needed for cough.  Dispense: 20 capsule; Refill: 0    Follow-up: Return in about 4 weeks (around 04/03/2021), or if symptoms worsen or fail to improve.    Trigo Winterbottom Jerold Coombe, NP

## 2021-03-06 NOTE — Patient Instructions (Signed)
Prediabetes Eating Plan Prediabetes is a condition that causes blood sugar (glucose) levels to be higher than normal. This increases the risk for developing type 2 diabetes (type 2 diabetes mellitus). Working with a health care provider or nutrition specialist (dietitian) to make diet and lifestyle changes can help prevent the onset of diabetes. These changes may help you:  Control your blood glucose levels.  Improve your cholesterol levels.  Manage your blood pressure. What are tips for following this plan? Reading food labels  Read food labels to check the amount of fat, salt (sodium), and sugar in prepackaged foods. Avoid foods that have: ? Saturated fats. ? Trans fats. ? Added sugars.  Avoid foods that have more than 300 milligrams (mg) of sodium per serving. Limit your sodium intake to less than 2,300 mg each day. Shopping  Avoid buying pre-made and processed foods.  Avoid buying drinks with added sugar. Cooking  Cook with olive oil. Do not use butter, lard, or ghee.  Bake, broil, grill, steam, or boil foods. Avoid frying. Meal planning  Work with your dietitian to create an eating plan that is right for you. This may include tracking how many calories you take in each day. Use a food diary, notebook, or mobile application to track what you eat at each meal.  Consider following a Mediterranean diet. This includes: ? Eating several servings of fresh fruits and vegetables each day. ? Eating fish at least twice a week. ? Eating one serving each day of whole grains, beans, nuts, and seeds. ? Using olive oil instead of other fats. ? Limiting alcohol. ? Limiting red meat. ? Using nonfat or low-fat dairy products.  Consider following a plant-based diet. This includes dietary choices that focus on eating mostly vegetables and fruit, grains, beans, nuts, and seeds.  If you have high blood pressure, you may need to limit your sodium intake or follow a diet such as the DASH  (Dietary Approaches to Stop Hypertension) eating plan. The DASH diet aims to lower high blood pressure.   Lifestyle  Set weight loss goals with help from your health care team. It is recommended that most people with prediabetes lose 7% of their body weight.  Exercise for at least 30 minutes 5 or more days a week.  Attend a support group or seek support from a mental health counselor.  Take over-the-counter and prescription medicines only as told by your health care provider. What foods are recommended? Fruits Berries. Bananas. Apples. Oranges. Grapes. Papaya. Mango. Pomegranate. Kiwi. Grapefruit. Cherries. Vegetables Lettuce. Spinach. Peas. Beets. Cauliflower. Cabbage. Broccoli. Carrots. Tomatoes. Squash. Eggplant. Herbs. Peppers. Onions. Cucumbers. Brussels sprouts. Grains Whole grains, such as whole-wheat or whole-grain breads, crackers, cereals, and pasta. Unsweetened oatmeal. Bulgur. Barley. Quinoa. Brown rice. Corn or whole-wheat flour tortillas or taco shells. Meats and other proteins Seafood. Poultry without skin. Lean cuts of pork and beef. Tofu. Eggs. Nuts. Beans. Dairy Low-fat or fat-free dairy products, such as yogurt, cottage cheese, and cheese. Beverages Water. Tea. Coffee. Sugar-free or diet soda. Seltzer water. Low-fat or nonfat milk. Milk alternatives, such as soy or almond milk. Fats and oils Olive oil. Canola oil. Sunflower oil. Grapeseed oil. Avocado. Walnuts. Sweets and desserts Sugar-free or low-fat pudding. Sugar-free or low-fat ice cream and other frozen treats. Seasonings and condiments Herbs. Sodium-free spices. Mustard. Relish. Low-salt, low-sugar ketchup. Low-salt, low-sugar barbecue sauce. Low-fat or fat-free mayonnaise. The items listed above may not be a complete list of recommended foods and beverages. Contact a dietitian for more   information. What foods are not recommended? Fruits Fruits canned with syrup. Vegetables Canned vegetables. Frozen  vegetables with butter or cream sauce. Grains Refined white flour and flour products, such as bread, pasta, snack foods, and cereals. Meats and other proteins Fatty cuts of meat. Poultry with skin. Breaded or fried meat. Processed meats. Dairy Full-fat yogurt, cheese, or milk. Beverages Sweetened drinks, such as iced tea and soda. Fats and oils Butter. Lard. Ghee. Sweets and desserts Baked goods, such as cake, cupcakes, pastries, cookies, and cheesecake. Seasonings and condiments Spice mixes with added salt. Ketchup. Barbecue sauce. Mayonnaise. The items listed above may not be a complete list of foods and beverages that are not recommended. Contact a dietitian for more information. Where to find more information  American Diabetes Association: www.diabetes.org Summary  You may need to make diet and lifestyle changes to help prevent the onset of diabetes. These changes can help you control blood sugar, improve cholesterol levels, and manage blood pressure.  Set weight loss goals with help from your health care team. It is recommended that most people with prediabetes lose 7% of their body weight.  Consider following a Mediterranean diet. This includes eating plenty of fresh fruits and vegetables, whole grains, beans, nuts, seeds, fish, and low-fat dairy, and using olive oil instead of other fats. This information is not intended to replace advice given to you by your health care provider. Make sure you discuss any questions you have with your health care provider. Document Revised: 01/11/2020 Document Reviewed: 01/11/2020 Elsevier Patient Education  2021 Elsevier Inc.  

## 2021-03-07 ENCOUNTER — Other Ambulatory Visit: Payer: Self-pay

## 2021-03-11 LAB — HCV RT-PCR, QUANT (NON-GRAPH): Hepatitis C Quantitation: NOT DETECTED IU/mL

## 2021-03-11 LAB — ACUTE VIRAL HEPATITIS (HAV, HBV, HCV)
HCV Ab: 1.1 s/co ratio — ABNORMAL HIGH (ref 0.0–0.9)
Hep A IgM: NEGATIVE
Hep B C IgM: NEGATIVE
Hepatitis B Surface Ag: NEGATIVE

## 2021-03-11 LAB — GAMMA GT: GGT: 35 IU/L (ref 0–60)

## 2021-04-09 ENCOUNTER — Other Ambulatory Visit: Payer: Self-pay

## 2021-04-09 ENCOUNTER — Ambulatory Visit: Payer: Medicaid Other | Admitting: Pharmacy Technician

## 2021-04-09 DIAGNOSIS — Z79899 Other long term (current) drug therapy: Secondary | ICD-10-CM

## 2021-04-09 NOTE — Progress Notes (Signed)
Completed [facility] application and contract.  Patient agreed to all terms of the [facility] contract.    Patient approved to receive medication assistance at [facility] until time for re-certification in 1610, and as long as eligibility criteria continues to be met.    Provided patient with community resource material based on her particular needs.    [facility]

## 2021-04-10 ENCOUNTER — Other Ambulatory Visit: Payer: Self-pay

## 2021-04-10 ENCOUNTER — Ambulatory Visit: Payer: Medicaid Other

## 2021-04-17 ENCOUNTER — Ambulatory Visit: Payer: Medicaid Other | Admitting: Gerontology

## 2021-04-17 ENCOUNTER — Other Ambulatory Visit: Payer: Self-pay

## 2021-04-17 VITALS — BP 114/78 | HR 102 | Temp 97.5°F | Resp 16 | Wt 145.3 lb

## 2021-04-17 DIAGNOSIS — R748 Abnormal levels of other serum enzymes: Secondary | ICD-10-CM

## 2021-04-17 DIAGNOSIS — R7303 Prediabetes: Secondary | ICD-10-CM

## 2021-04-17 MED ORDER — METFORMIN HCL 1000 MG PO TABS
500.0000 mg | ORAL_TABLET | Freq: Every day | ORAL | 4 refills | Status: AC
  Filled 2021-04-17 – 2021-05-21 (×2): qty 30, 60d supply, fill #0
  Filled 2021-07-28: qty 30, 60d supply, fill #1

## 2021-04-17 MED ORDER — METFORMIN HCL 1000 MG PO TABS
500.0000 mg | ORAL_TABLET | Freq: Every day | ORAL | 2 refills | Status: DC
  Filled 2021-04-17: qty 30, 60d supply, fill #0

## 2021-04-17 NOTE — Progress Notes (Signed)
Established Patient Office Visit  Subjective:  Patient ID: Madison Fernandez, female    DOB: 03/11/2001  Age: 20 y.o. MRN: [id number]  CC: No chief complaint on file.   HPI Madison Fernandez is a 20 y.o. female who  has a past medical history of Endometriosis (01/2018), and Ovarian cyst (01/2018) presents for routine follow up visit, medication refill and lab review. She was started on Metformin 500 mg on 03/06/21 for HgbA1c of 6%.She states that she's tolerating medication and denies side effects. She also c/o chronic intermittent generalized abdominal pain, stating that she has history of IBS. She reports moving her bowel daily and most of the times she has 3-4 loose stools in a day and sometimes constipation. She states that she experiences pain when she eats and moving her bowel immediately relieves her pain. Her liver enzymes done on 02/20/21, ALT was 68. She denies alcohol consumption, right upper quadrant abdominal pain, and dark urine. Her HCV Ab checked on 03/06/21 was positive. Overall, she states that she's doing well and offers no further complaint.  Past Medical History:  Diagnosis Date   Endometriosis    Endometriosis 01/2018   Ovarian cyst 01/2018    Past Surgical History:  Procedure Laterality Date   NO PAST SURGERIES      Family History  Problem Relation Age of Onset   Ovarian cancer Neg Hx    Colon cancer Neg Hx    Breast cancer Neg Hx    Diabetes Neg Hx     Social History   Socioeconomic History   Marital status: Single    Spouse name: Not on file   Number of children: Not on file   Years of education: Not on file   Highest education level: Not on file  Occupational History   Not on file  Tobacco Use   Smoking status: Never   Smokeless tobacco: Never  Vaping Use   Vaping Use: Never used  Substance and Sexual Activity   Alcohol use: No   Drug use: No   Sexual activity: Not Currently    Birth control/protection: Implant  Other Topics Concern    Not on file  Social History Narrative   Not on file   Social Determinants of Health   Financial Resource Strain: Not on file  Food Insecurity: No Food Insecurity   Worried About Running Out of Food in the Last Year: Never true   Ran Out of Food in the Last Year: Never true  Transportation Needs: No Transportation Needs   Lack of Transportation (Medical): No   Lack of Transportation (Non-Medical): No  Physical Activity: Not on file  Stress: Not on file  Social Connections: Not on file  Intimate Partner Violence: Not on file    Outpatient Medications Prior to Visit  Medication Sig Dispense Refill   benzonatate (TESSALON PERLES) 100 MG capsule Take 1 capsule (100 mg total) by mouth 3 (three) times daily as needed for cough. 20 capsule 0   Elagolix Sodium (ORILISSA) 150 MG TABS Take 150 mg by mouth daily. (Patient not taking: No sig reported) 30 tablet 6   metFORMIN (GLUCOPHAGE) 1000 MG tablet Take 0.5 tablet (500 mg total) by mouth daily with breakfast. 30 tablet 0   No facility-administered medications prior to visit.    Allergies  Allergen Reactions   Sulfa Antibiotics Rash    ROS Review of Systems  Constitutional: Negative.   HENT: Negative.    Respiratory: Negative.    Cardiovascular: Negative.  Gastrointestinal:  Positive for abdominal pain.  Skin: Negative.   Neurological: Negative.   Psychiatric/Behavioral: Negative.       Objective:      BP 114/78 (BP Location: Left Arm, Patient Position: Sitting, Cuff Size: Normal)   Pulse (!) 102   Temp (!) 97.5 F (36.4 C)   Resp 16   Wt 145 lb 4.8 oz (65.9 kg)   SpO2 97%   BMI 29.35 kg/m  Wt Readings from Last 3 Encounters:  04/17/21 145 lb 4.8 oz (65.9 kg)  03/06/21 146 lb 6.4 oz (66.4 kg)  02/20/21 144 lb 12.8 oz (65.7 kg)     Health Maintenance Due  Topic Date Due   Pneumococcal Vaccine 54-53 Years old (1 - PCV) Never done   HPV VACCINES (1 - 2-dose series) Never done   PAP-Cervical Cytology Screening   Never done   PAP SMEAR-Modifier  Never done       Topic Date Due   HPV VACCINES (1 - 2-dose series) Never done    Lab Results  Component Value Date   TSH 1.040 02/20/2021   Lab Results  Component Value Date   WBC 13.7 (H) 11/25/2020   HGB 14.0 11/25/2020   HCT 42.4 11/25/2020   MCV 88.5 11/25/2020   PLT 328 11/25/2020   Lab Results  Component Value Date   NA 144 02/20/2021   K 4.6 02/20/2021   CO2 20 02/20/2021   GLUCOSE 88 02/20/2021   BUN 6 02/20/2021   CREATININE 0.64 02/20/2021   BILITOT 0.4 02/20/2021   ALKPHOS 123 (H) 02/20/2021   AST 37 02/20/2021   ALT 68 (H) 02/20/2021   PROT 7.5 02/20/2021   ALBUMIN 4.8 02/20/2021   CALCIUM 9.8 02/20/2021   ANIONGAP 12 11/25/2020   EGFR 129 02/20/2021   Lab Results  Component Value Date   CHOL 166 02/20/2021   Lab Results  Component Value Date   HDL 50 02/20/2021   Lab Results  Component Value Date   LDLCALC 97 02/20/2021   Lab Results  Component Value Date   TRIG 106 02/20/2021   No results found for: [facility] Lab Results  Component Value Date   HGBA1C 6.0 (H) 02/20/2021      Assessment & Plan:   1. Prediabetes -She will continue on current medication, low carb/non concentrated sweet diet. - HgB A1c; Future - metFORMIN (GLUCOPHAGE) 1000 MG tablet; Take 0.5 tablet (500 mg total) by mouth daily with breakfast.  Dispense: 30 tablet; Refill: 4  2. Elevated liver enzymes - She was encouraged to complete [employer] application for - Ambulatory referral to Gastroenterology      Follow-up: Return in about 4 months (around 08/06/2021), or if symptoms worsen or fail to improve.    Sujey Gundry Jerold Coombe, NP

## 2021-04-18 ENCOUNTER — Other Ambulatory Visit: Payer: Self-pay

## 2021-04-21 ENCOUNTER — Other Ambulatory Visit: Payer: Self-pay

## 2021-04-23 ENCOUNTER — Encounter: Payer: Self-pay | Admitting: *Deleted

## 2021-05-21 ENCOUNTER — Other Ambulatory Visit: Payer: Self-pay | Admitting: Gerontology

## 2021-05-21 ENCOUNTER — Other Ambulatory Visit: Payer: Self-pay

## 2021-05-21 DIAGNOSIS — R748 Abnormal levels of other serum enzymes: Secondary | ICD-10-CM

## 2021-05-22 ENCOUNTER — Other Ambulatory Visit: Payer: Self-pay

## 2021-07-29 ENCOUNTER — Other Ambulatory Visit: Payer: Self-pay

## 2021-07-31 ENCOUNTER — Other Ambulatory Visit: Payer: Self-pay

## 2021-07-31 ENCOUNTER — Other Ambulatory Visit: Payer: Medicaid Other

## 2021-07-31 DIAGNOSIS — R7303 Prediabetes: Secondary | ICD-10-CM

## 2021-08-01 LAB — HEMOGLOBIN A1C
Est. average glucose Bld gHb Est-mCnc: 120 mg/dL
Hgb A1c MFr Bld: 5.8 % — ABNORMAL HIGH (ref 4.8–5.6)

## 2021-08-07 ENCOUNTER — Ambulatory Visit: Payer: Medicaid Other

## 2021-08-14 ENCOUNTER — Encounter: Payer: Self-pay | Admitting: Gastroenterology

## 2021-08-14 ENCOUNTER — Ambulatory Visit (INDEPENDENT_AMBULATORY_CARE_PROVIDER_SITE_OTHER): Payer: Self-pay | Admitting: Gastroenterology

## 2021-08-14 ENCOUNTER — Other Ambulatory Visit: Payer: Self-pay

## 2021-08-14 VITALS — BP 117/77 | HR 111 | Ht 59.0 in | Wt 152.8 lb

## 2021-08-14 DIAGNOSIS — R748 Abnormal levels of other serum enzymes: Secondary | ICD-10-CM

## 2021-08-14 NOTE — Progress Notes (Signed)
Gastroenterology Consultation  Referring Provider:     Rolm Gala, NP Primary Care Physician:  No primary care provider on file. Primary Gastroenterologist:  Dr. Servando Snare     Reason for Consultation:     Abnormal liver enzymes        HPI:   Madison Fernandez is a 20 y.o. y/o female referred for consultation & management of Abnormal liver enzymes by Dr. Bonnetta Barry primary care provider on file..  This patient comes in today with a history of abnormal liver enzymes.  The patient suffers from diabetes and has had an elevated A1c in addition to being on metformin. The patient had a acute viral hepatitis panel sends off with the hepatitis C antibody being slightly elevated at 1.1 with the normal being 0.9.  A subsequent viral level was undetectable. The patient's liver enzymes have shown the abdomen phosphatase to be slightly elevated at 123 with a normal AST of 37 and an elevated ALT of 68. The patient denies any alcohol abuse or drug use.  She also denies any fevers chills nausea vomiting or dark urine.  She does report that she has gained some weight. She did have a GGT sent off that was normal at 35 back in May.  Past Medical History:  Diagnosis Date   Endometriosis    Endometriosis 01/2018   Ovarian cyst 01/2018    Past Surgical History:  Procedure Laterality Date   NO PAST SURGERIES      Prior to Admission medications   Medication Sig Start Date End Date Taking? Authorizing Provider  metFORMIN (GLUCOPHAGE) 1000 MG tablet Take (1/2) tablet (500 mg total) by mouth once daily with breakfast. 04/17/21  Yes Iloabachie, Chioma E, NP    Family History  Problem Relation Age of Onset   Ovarian cancer Neg Hx    Colon cancer Neg Hx    Breast cancer Neg Hx    Diabetes Neg Hx      Social History   Tobacco Use   Smoking status: Never   Smokeless tobacco: Never  Vaping Use   Vaping Use: Never used  Substance Use Topics   Alcohol use: No   Drug use: No    Allergies as of  08/14/2021 - Review Complete 08/14/2021  Allergen Reaction Noted   Sulfa antibiotics Rash 02/20/2021    Review of Systems:    All systems reviewed and negative except where noted in HPI.   Physical Exam:  BP 117/77 (BP Location: Left Arm, Patient Position: Sitting, Cuff Size: Normal)   Pulse (!) 111   Ht 4\' 11"  (1.499 m)   Wt 152 lb 12.8 oz (69.3 kg)   BMI 30.86 kg/m  No LMP recorded. Patient has had an implant. General:   Alert,  Well-developed, well-nourished, pleasant and cooperative in NAD Head:  Normocephalic and atraumatic. Eyes:  Sclera clear, no icterus.   Conjunctiva pink. Ears:  Normal auditory acuity. Neck:  Supple; no masses or thyromegaly. Lungs:  Respirations even and unlabored.  Clear throughout to auscultation.   No wheezes, crackles, or rhonchi. No acute distress. Heart:  Regular rate and rhythm; no murmurs, clicks, rubs, or gallops. Abdomen:  Normal bowel sounds.  No bruits.  Soft, non-tender and non-distended without masses, hepatosplenomegaly or hernias noted.  No guarding or rebound tenderness.  Negative Carnett sign.   Rectal:  Deferred.  Pulses:  Normal pulses noted. Extremities:  No clubbing or edema.  No cyanosis. Neurologic:  Alert and oriented x3;  grossly normal  neurologically. Skin:  Intact without significant lesions or rashes.  No jaundice. Lymph Nodes:  No significant cervical adenopathy. Psych:  Alert and cooperative. Normal mood and affect.  Imaging Studies: No results found.  Assessment and Plan:   Madison Fernandez is a 20 y.o. y/o female who comes in today with abnormal liver enzymes with an elevated ALT and alkaline phosphatase. The patient has a elevated hemoglobin A1c and a BMI of 30.8. She has been told that fatty liver is likely the cause of her abnormal liver enzymes but will have her blood sends off for other possible causes of abnormal liver enzymes.  She will also be set up for a right upper quadrant ultrasound.  Her blood will be  sent off for immunity to hepatitis A and hepatitis B and she will be vaccinated accordingly.  The patient has been explained the plan and agrees with it.    Midge Minium, MD. Clementeen Graham    Note: This dictation was prepared with Dragon dictation along with smaller phrase technology. Any transcriptional errors that result from this process are unintentional.

## 2021-08-17 LAB — HEPATIC FUNCTION PANEL
ALT: 27 IU/L (ref 0–32)
AST: 21 IU/L (ref 0–40)
Albumin: 4.6 g/dL (ref 3.9–5.0)
Alkaline Phosphatase: 119 IU/L (ref 44–121)
Bilirubin Total: <0.2 mg/dL (ref 0.0–1.2)
Bilirubin, Direct: <0.1 mg/dL (ref 0.00–0.40)
Total Protein: 7.1 g/dL (ref 6.0–8.5)

## 2021-08-17 LAB — IRON AND TIBC
Iron Saturation: 7 % — CL (ref 15–55)
Iron: 22 ug/dL — ABNORMAL LOW (ref 27–159)
Total Iron Binding Capacity: 338 ug/dL (ref 250–450)
UIBC: 316 ug/dL (ref 131–425)

## 2021-08-17 LAB — ANTI-SMOOTH MUSCLE ANTIBODY, IGG: Smooth Muscle Ab: 4 Units (ref 0–19)

## 2021-08-17 LAB — ALPHA-1-ANTITRYPSIN: A-1 Antitrypsin: 121 mg/dL (ref 100–188)

## 2021-08-17 LAB — ANA: Anti Nuclear Antibody (ANA): NEGATIVE

## 2021-08-17 LAB — FERRITIN: Ferritin: 140 ng/mL (ref 15–150)

## 2021-08-17 LAB — MITOCHONDRIAL ANTIBODIES: Mitochondrial Ab: <20 Units (ref 0.0–20.0)

## 2021-08-17 LAB — HEPATITIS B SURFACE ANTIBODY,QUALITATIVE: Hep B Surface Ab, Qual: NONREACTIVE

## 2021-08-17 LAB — CERULOPLASMIN: Ceruloplasmin: 23.3 mg/dL (ref 19.0–39.0)

## 2021-08-19 ENCOUNTER — Encounter: Payer: Self-pay | Admitting: Gerontology

## 2021-08-19 ENCOUNTER — Ambulatory Visit: Payer: Medicaid Other | Admitting: Gerontology

## 2021-08-19 ENCOUNTER — Other Ambulatory Visit: Payer: Self-pay

## 2021-08-19 VITALS — BP 115/80 | HR 106 | Temp 97.6°F | Resp 16 | Ht 59.0 in | Wt 152.8 lb

## 2021-08-19 DIAGNOSIS — K5900 Constipation, unspecified: Secondary | ICD-10-CM | POA: Insufficient documentation

## 2021-08-19 DIAGNOSIS — R000 Tachycardia, unspecified: Secondary | ICD-10-CM | POA: Insufficient documentation

## 2021-08-19 DIAGNOSIS — R059 Cough, unspecified: Secondary | ICD-10-CM

## 2021-08-19 MED ORDER — BENZONATATE 100 MG PO CAPS
100.0000 mg | ORAL_CAPSULE | Freq: Three times a day (TID) | ORAL | 0 refills | Status: DC | PRN
  Filled 2021-08-19: qty 20, 7d supply, fill #0

## 2021-08-19 NOTE — Patient Instructions (Signed)

## 2021-08-19 NOTE — Progress Notes (Signed)
Established Patient Office Visit  Subjective:  Patient ID: Madison Fernandez, female    DOB: 2001-02-24  Age: 20 y.o. MRN: [id number]  CC:  Chief Complaint  Patient presents with   Follow-up   Prediabetes    Patient has A1C drawn on 07/31/21    HPI Madison Fernandez  is a 20 y.o. female who  has a past medical history of Endometriosis (01/2018), and Ovarian cyst (01/2018) presents for routine follow up visit and lab review. Her HgbA1c done on 07/31/21 was 5.8%, she continues on Metformin, denies side effects.She was seen by Dr. Allen Norris gastroenterology on 08/14/2021 for abnormal liver enzymes, and she will have right upper quadrant ultrasound done before follow-up.  She denies abdominal pain, and jaundice.  Her heart rate was elevated during visit, she states that she does not drink enough water and hardly exercises.  She denies chest pain, palpitation and shortness of breath. She c/o constipation, stating that she last moved her bowel on Friday. She states that her stool is hard, denies straining. She c/o worsening productive cough with clear phlegm that has been going on for 1 year. she states that cough wakes her up at night.  Overall, she states that she is doing well and offers no further complaint.   Past Medical History:  Diagnosis Date   Endometriosis    Endometriosis 01/2018   Ovarian cyst 01/2018    Past Surgical History:  Procedure Laterality Date   NO PAST SURGERIES      Family History  Problem Relation Age of Onset   Asthma Mother    Heart disease Mother    Diabetes Mother    Hypertension Mother    Diverticulosis Mother    Diabetes Father        pre diabetes   Hyperlipidemia Father    Hypertension Father    Asthma Brother    Diabetes Brother    Ovarian cancer Neg Hx    Colon cancer Neg Hx    Breast cancer Neg Hx     Social History   Socioeconomic History   Marital status: Single    Spouse name: Not on file   Number of children: Not on file   Years of  education: Not on file   Highest education level: Not on file  Occupational History   Not on file  Tobacco Use   Smoking status: Never   Smokeless tobacco: Never  Vaping Use   Vaping Use: Never used  Substance and Sexual Activity   Alcohol use: No   Drug use: No   Sexual activity: Not Currently    Birth control/protection: Implant  Other Topics Concern   Not on file  Social History Narrative   Not on file   Social Determinants of Health   Financial Resource Strain: Not on file  Food Insecurity: No Food Insecurity   Worried About Running Out of Food in the Last Year: Never true   Ran Out of Food in the Last Year: Never true  Transportation Needs: No Transportation Needs   Lack of Transportation (Medical): No   Lack of Transportation (Non-Medical): No  Physical Activity: Not on file  Stress: Not on file  Social Connections: Not on file  Intimate Partner Violence: Not on file    Outpatient Medications Prior to Visit  Medication Sig Dispense Refill   metFORMIN (GLUCOPHAGE) 1000 MG tablet Take (1/2) tablet (500 mg total) by mouth once daily with breakfast. 30 tablet 4   No facility-administered medications  prior to visit.    Allergies  Allergen Reactions   Sulfa Antibiotics Rash    ROS Review of Systems  Constitutional: Negative.   Respiratory:  Positive for cough.   Cardiovascular: Negative.   Gastrointestinal:  Positive for constipation.  Neurological: Negative.   Psychiatric/Behavioral: Negative.       Objective:    Physical Exam HENT:     Head: Normocephalic and atraumatic.     Mouth/Throat:     Mouth: Mucous membranes are moist.  Cardiovascular:     Rate and Rhythm: Tachycardia present.     Pulses: Normal pulses.     Heart sounds: Normal heart sounds.  Pulmonary:     Effort: Pulmonary effort is normal.     Breath sounds: Normal breath sounds.  Abdominal:     General: Bowel sounds are normal.     Palpations: Abdomen is soft.  Skin:    General:  Skin is warm.  Neurological:     General: No focal deficit present.     Mental Status: She is alert and oriented to person, place, and time. Mental status is at baseline.    BP 115/80 (BP Location: Left Arm, Patient Position: Sitting, Cuff Size: Normal)   Pulse (!) 106   Temp 97.6 F (36.4 C)   Resp 16   Ht _0  (1.499 m)   Wt 152 lb 12.8 oz (69.3 kg)   SpO2 97%   BMI 30.86 kg/m  Wt Readings from Last 3 Encounters:  08/19/21 152 lb 12.8 oz (69.3 kg)  08/14/21 152 lb 12.8 oz (69.3 kg)  04/17/21 145 lb 4.8 oz (65.9 kg)   Encouraged weight loss  Health Maintenance Due  Topic Date Due   COVID-19 Vaccine (1) Never done   Pneumococcal Vaccine 58-81 Years old (1 - PCV) Never done   HPV VACCINES (1 - 2-dose series) Never done   HIV Screening  Never done   TETANUS/TDAP  Never done   PAP-Cervical Cytology Screening  Never done   PAP SMEAR-Modifier  Never done   INFLUENZA VACCINE  Never done       Topic Date Due   HPV VACCINES (1 - 2-dose series) Never done    Lab Results  Component Value Date   TSH 1.040 02/20/2021   Lab Results  Component Value Date   WBC 13.7 (H) 11/25/2020   HGB 14.0 11/25/2020   HCT 42.4 11/25/2020   MCV 88.5 11/25/2020   PLT 328 11/25/2020   Lab Results  Component Value Date   NA 144 02/20/2021   K 4.6 02/20/2021   CO2 20 02/20/2021   GLUCOSE 88 02/20/2021   BUN 6 02/20/2021   CREATININE 0.64 02/20/2021   BILITOT <0.2 08/14/2021   ALKPHOS 119 08/14/2021   AST 21 08/14/2021   ALT 27 08/14/2021   PROT 7.1 08/14/2021   ALBUMIN 4.6 08/14/2021   CALCIUM 9.8 02/20/2021   ANIONGAP 12 11/25/2020   EGFR 129 02/20/2021   Lab Results  Component Value Date   CHOL 166 02/20/2021   Lab Results  Component Value Date   HDL 50 02/20/2021   Lab Results  Component Value Date   LDLCALC 97 02/20/2021   Lab Results  Component Value Date   TRIG 106 02/20/2021   No results found for: [facility] Lab Results  Component Value Date   HGBA1C 5.8  (H) 07/31/2021      Assessment & Plan:   1. Constipation, unspecified constipation type -She was encouraged to increase fiber  in her diet, water intake and exercise as tolerated.  She was advised to notify clinic for worsening constipation.  2. Tachycardia -Her heart rate was 105 when rechecked, she was advised to increase water intake. -She was advised to notify clinic or go to the emergency room for worsening symptoms.  3. Cough, unspecified type -She will continue on Tessalon Perles, and advised to notify clinic for worsening symptoms. - benzonatate (TESSALON PERLES) 100 MG capsule; Take 1 capsule (100 mg total) by mouth 3 (three) times daily as needed for cough.  Dispense: 20 capsule; Refill: 0     Follow-up: Return in about 5 weeks (around 09/23/2021), or if symptoms worsen or fail to improve.    Oluwatosin Higginson Jerold Coombe, NP

## 2021-08-20 ENCOUNTER — Other Ambulatory Visit: Payer: Self-pay

## 2021-08-21 ENCOUNTER — Telehealth: Payer: Self-pay

## 2021-08-21 ENCOUNTER — Other Ambulatory Visit: Payer: Self-pay

## 2021-08-21 DIAGNOSIS — R748 Abnormal levels of other serum enzymes: Secondary | ICD-10-CM

## 2021-08-21 DIAGNOSIS — E611 Iron deficiency: Secondary | ICD-10-CM

## 2021-08-21 NOTE — Telephone Encounter (Signed)
-----   Message from Midge Minium, MD sent at 08/15/2021  1:38 PM EDT ----- Regarding: FW: Let the patient know that her iron is very low and she should have her blood checked for spruce which can cause this and increased liver tests. ----- Message ----- From: Nell Range Lab Results In Sent: 08/15/2021   7:38 AM EDT To: Midge Minium, MD

## 2021-08-21 NOTE — Telephone Encounter (Signed)
Pt notified of lab results

## 2021-08-21 NOTE — Telephone Encounter (Signed)
-----   Message from Midge Minium, MD sent at 08/17/2021  4:52 PM EDT ----- Please let the patient know that her liver enzymes have come back to normal but the low iron is concerning.  We need to find out if she is having heavy periods or if there are other signs of any blood loss in addition to the message I sent you about getting her sprue antibodies checked.

## 2021-08-29 ENCOUNTER — Ambulatory Visit: Payer: Medicaid Other

## 2021-08-29 DIAGNOSIS — Z79899 Other long term (current) drug therapy: Secondary | ICD-10-CM

## 2021-08-29 NOTE — Progress Notes (Signed)
Medication Management Clinic Visit Note  Patient: Madison Fernandez MRN: [id number] Date of Birth: Feb 13, 2001 PCP: No primary care provider on file.   Alonza Smoker 20 y.o. female presents for a annual MTM visit today. Pt identified with 2 patient identifiers (name and DOB)  There were no vitals taken for this visit.  Patient Information   Past Medical History:  Diagnosis Date   Endometriosis    Endometriosis 01/2018   Ovarian cyst 01/2018      Past Surgical History:  Procedure Laterality Date   NO PAST SURGERIES       Family History  Problem Relation Age of Onset   Asthma Mother    Heart disease Mother    Diabetes Mother    Hypertension Mother    Diverticulosis Mother    Diabetes Father        pre diabetes   Hyperlipidemia Father    Hypertension Father    Asthma Brother    Diabetes Brother    Ovarian cancer Neg Hx    Colon cancer Neg Hx    Breast cancer Neg Hx     New Diagnoses (since last visit): None   Family Support: Good  Lifestyle Diet: usually only eats 1 meal a day with snacks Meal: Home cooked chicken, hamburgers with beans, rice, or vegetables  Snacks: fruit, chips      Current Exercise Habits: Home exercise routine, Type of exercise: walking, Time (Minutes): 30, Frequency (Times/Week): 4, Weekly Exercise (Minutes/Week): 120, Intensity: Mild       Social History   Substance and Sexual Activity  Alcohol Use No      Social History   Tobacco Use  Smoking Status Never  Smokeless Tobacco Never      Health Maintenance  Topic Date Due   COVID-19 Vaccine (1) Never done   Pneumococcal Vaccine 36-55 Years old (1 - PCV) Never done   HPV VACCINES (1 - 2-dose series) Never done   HIV Screening  Never done   TETANUS/TDAP  Never done   PAP-Cervical Cytology Screening  Never done   PAP SMEAR-Modifier  Never done   INFLUENZA VACCINE  Never done   Hepatitis C Screening  Completed   Health Maintenance/Date Completed  Last ED  visit: 11/25/2020 Last Visit to PCP: 08/19/2021 Next Visit to PCP: 3 months  Specialist Visit: 08/14/2021 Dental Exam: does not recall last appointment, pt to schedule  Eye Exam: >1 year since last visit  Prostate Exam: N/A Pelvic/PAP Exam: does not recall last appointment  Mammogram: N/A DEXA: N/A Colonoscopy: N/A Flu Vaccine: not received  Pneumonia Vaccine: N/A COVID-19 Vaccine: received 3 doses  Shingrix Vaccine: N/A  Outpatient Encounter Medications as of 08/29/2021  Medication Sig   benzonatate (TESSALON PERLES) 100 MG capsule Take 1 capsule (100 mg total) by mouth 3 (three) times daily as needed for cough.   metFORMIN (GLUCOPHAGE) 1000 MG tablet Take (1/2) tablet (500 mg total) by mouth once daily with breakfast.   No facility-administered encounter medications on file as of 08/29/2021.      Assessment and Plan: Medication adherence: pt reported good adherence to medications and was able to recall doses and directions  Endometriosis  --Pt currently taking metformin and reports no issues   Pt educated on importance of dental and vision appointments. Pt given education on continued exercise and dietary changes. Encouraged and counseled pt to continue adherence to metformin. Pt agreeable to all education and suggestions. Plan to return to clinic in 1 year for  annual MTM.  RTC: 1 year   Doloris Hall, PharmD Pharmacy Resident  08/29/2021 11:59 AM

## 2021-09-01 ENCOUNTER — Other Ambulatory Visit: Payer: Self-pay

## 2021-09-17 IMAGING — CT CT RENAL STONE PROTOCOL
3 of 4 series · 9 of 46 positions shown, 16 images · non-contrast
Comparison: 06/22/2018

CLINICAL DATA: Back and abdominal pain, vomiting, right-sided,
burning with urination

EXAM:
CT ABDOMEN AND PELVIS WITHOUT CONTRAST
TECHNIQUE: Multidetector CT imaging of the abdomen and pelvis was performed
following the standard protocol without IV contrast.

[Series 4: lung bases · axial · 0.68mm/px · z∈[+604,+694]mm · 5 of 28 slices shown, 10 images]
[im 5/28  soft-tissue]
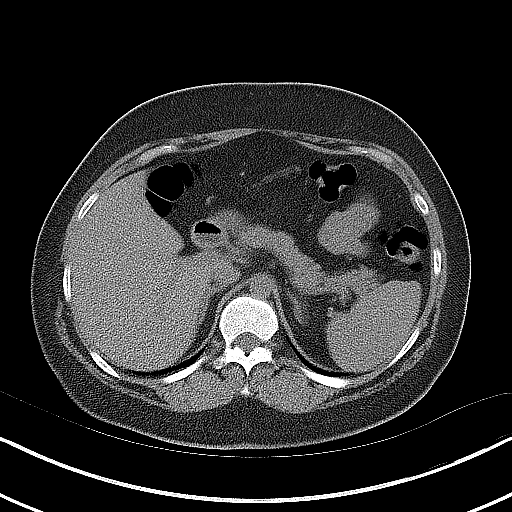
[im 5/28  bone]
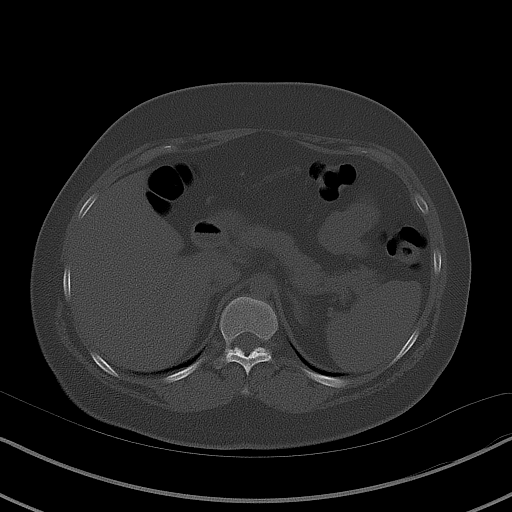
[im 10/28  soft-tissue]
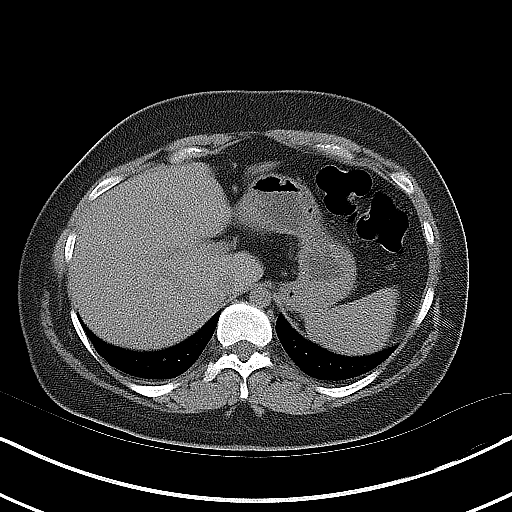
[im 10/28  lung]
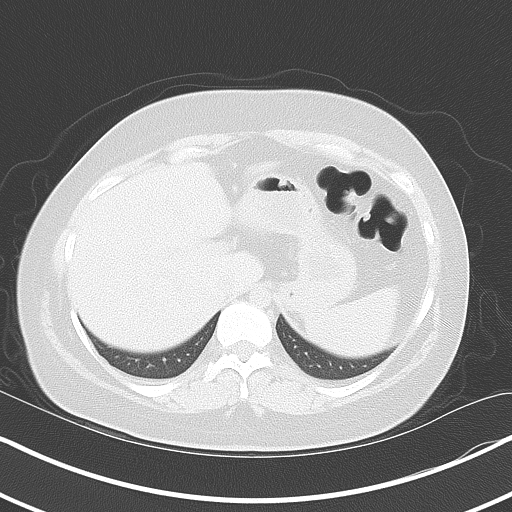
[im 14/28  soft-tissue]
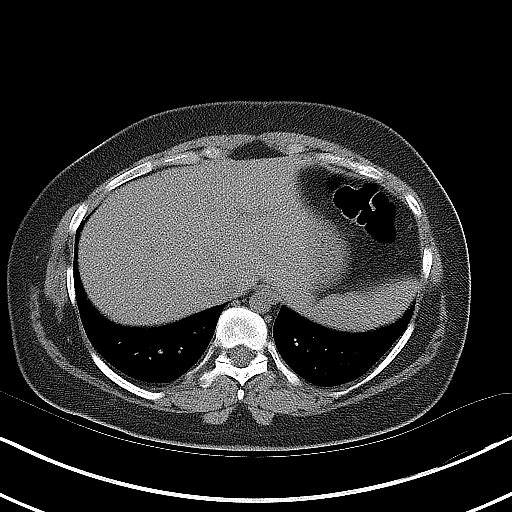
[im 14/28  lung]
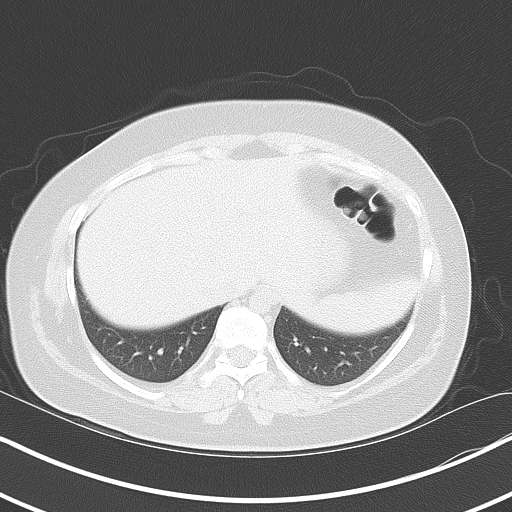
[im 19/28  soft-tissue]
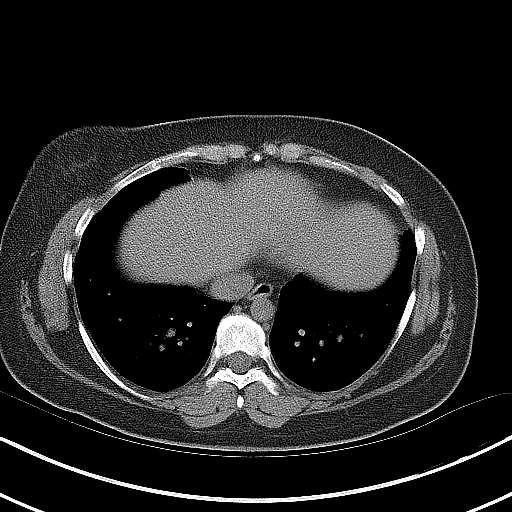
[im 19/28  lung]
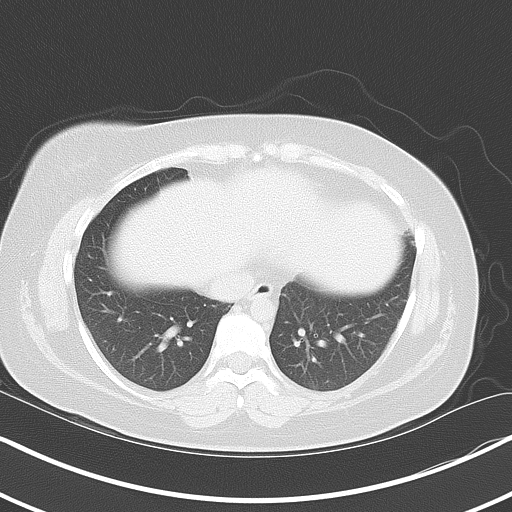
[im 23/28  soft-tissue]
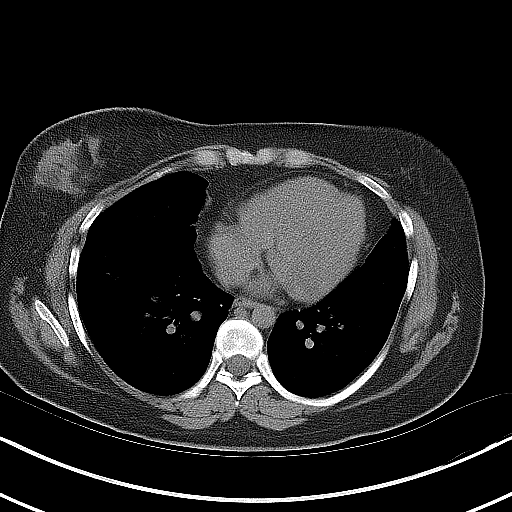
[im 23/28  lung]
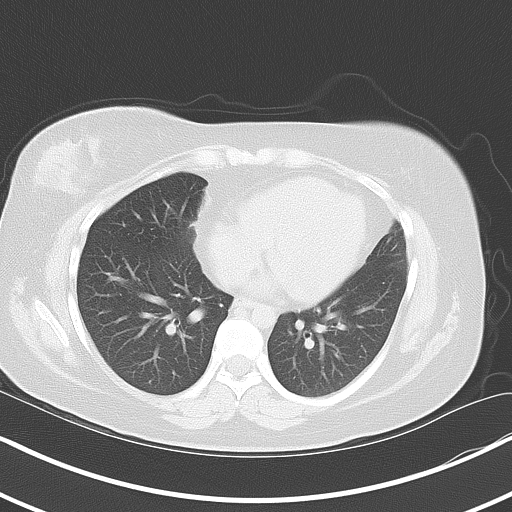

[Series 5: coronal · coronal · 0.79mm/px · 3 of 129 slices shown, 4 images]
[im 43/129  soft-tissue]
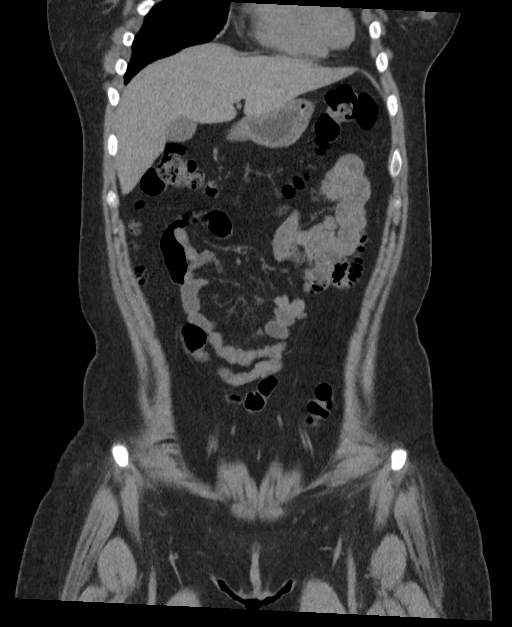
[im 57/129  soft-tissue]
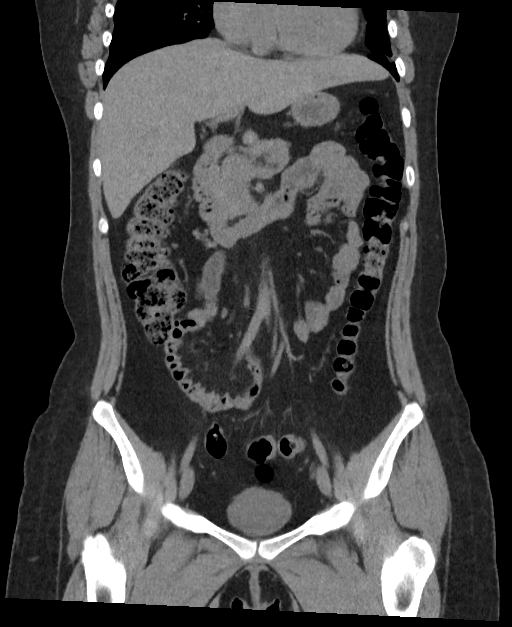
[im 57/129  bone]
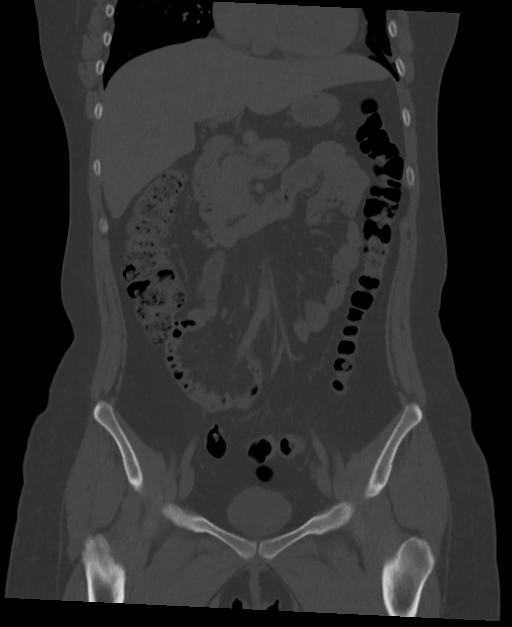
[im 72/129  soft-tissue]
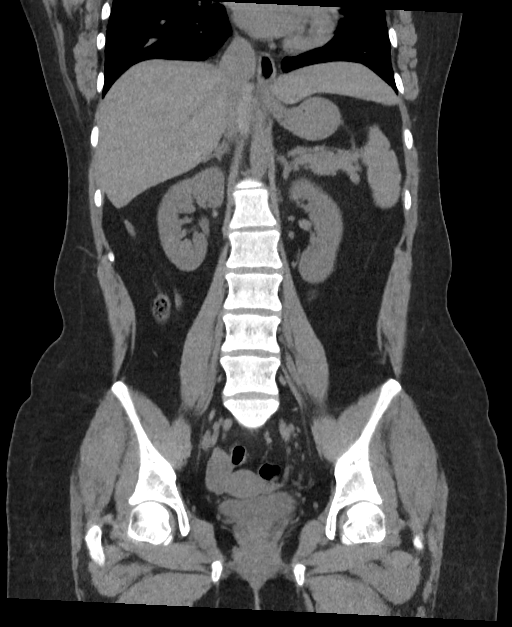

[Series 6: sagittal · sagittal · 0.50mm/px · 1 of 165 slices shown, 2 images]
[im 55/165  soft-tissue]
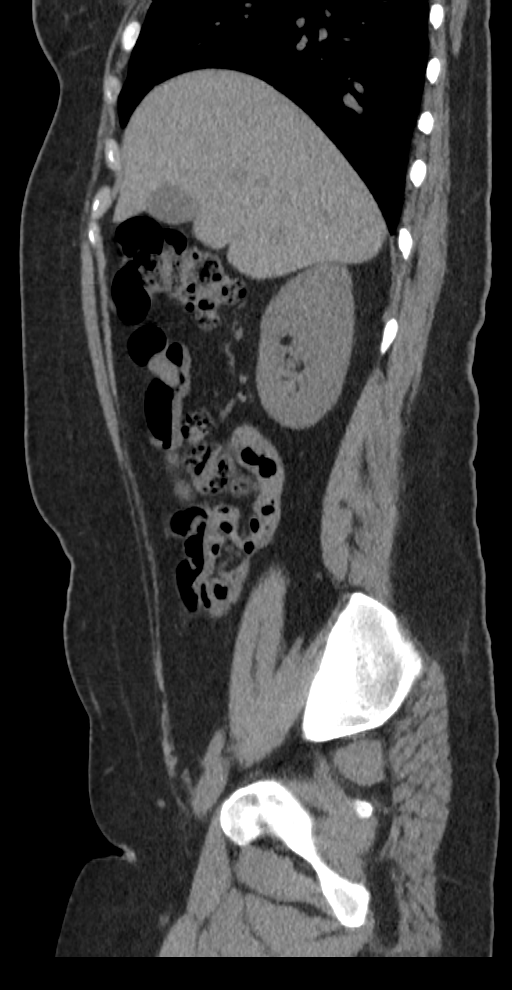
[im 55/165  bone]
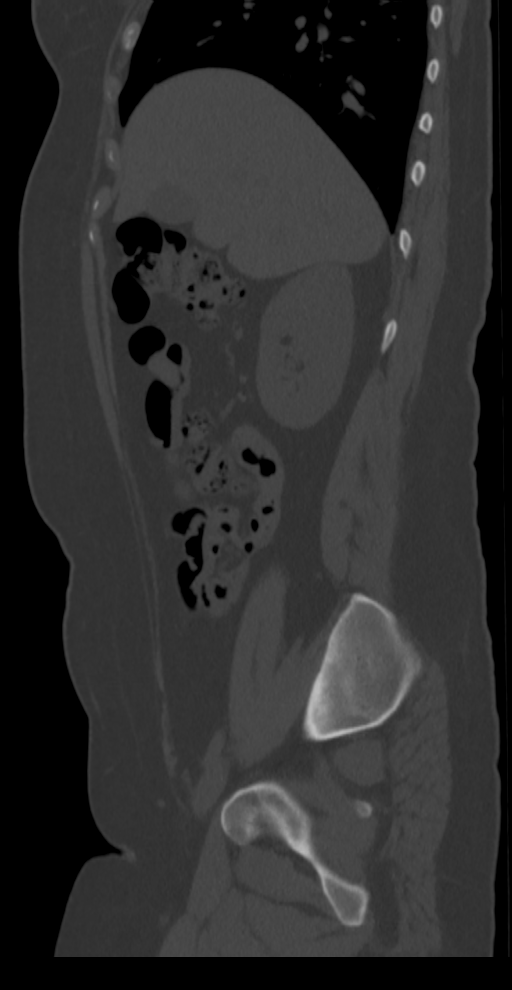

[9 of 46 positions shown; findings below may reference images not displayed]

FINDINGS: Lower chest: No acute abnormality.

Hepatobiliary: No solid liver abnormality is seen. No gallstones,
gallbladder wall thickening, or biliary dilatation.

Pancreas: Unremarkable. No pancreatic ductal dilatation or
surrounding inflammatory changes.

Spleen: Normal in size without significant abnormality.

Adrenals/Urinary Tract: Adrenal glands are unremarkable. Kidneys are
normal, without renal calculi, solid lesion, or hydronephrosis.
Bladder is unremarkable. Incidental note of a small urachal remnant.

Stomach/Bowel: Stomach is within normal limits. Appendix appears
normal. No evidence of bowel wall thickening, distention, or
inflammatory changes.

Vascular/Lymphatic: No significant vascular findings are present. No
enlarged abdominal or pelvic lymph nodes.

Reproductive: No mass or other significant abnormality.

Other: No abdominal wall hernia or abnormality. No abdominopelvic
ascites.

Musculoskeletal: No acute or significant osseous findings.
IMPRESSION: No non-contrast CT findings of the abdomen or pelvis to explain
pain, nausea, or vomiting. No evidence of urinary tract calculus or
hydronephrosis.

## 2021-09-23 ENCOUNTER — Other Ambulatory Visit: Payer: Self-pay

## 2021-09-23 ENCOUNTER — Ambulatory Visit: Payer: Medicaid Other | Admitting: Gerontology

## 2021-09-23 ENCOUNTER — Encounter: Payer: Self-pay | Admitting: Gerontology

## 2021-09-23 VITALS — BP 111/75 | HR 84 | Temp 97.9°F | Resp 16 | Ht 59.0 in | Wt 153.1 lb

## 2021-09-23 DIAGNOSIS — R059 Cough, unspecified: Secondary | ICD-10-CM

## 2021-09-23 DIAGNOSIS — R7303 Prediabetes: Secondary | ICD-10-CM

## 2021-09-23 DIAGNOSIS — R748 Abnormal levels of other serum enzymes: Secondary | ICD-10-CM

## 2021-09-23 DIAGNOSIS — R4589 Other symptoms and signs involving emotional state: Secondary | ICD-10-CM

## 2021-09-23 DIAGNOSIS — Z0000 Encounter for general adult medical examination without abnormal findings: Secondary | ICD-10-CM

## 2021-09-23 MED ORDER — BENZONATATE 100 MG PO CAPS: 100.0000 mg | ORAL_CAPSULE | Freq: Three times a day (TID) | ORAL | 0 refills | Status: DC | PRN

## 2021-09-23 NOTE — Progress Notes (Signed)
Established Patient Office Visit  Subjective:  Patient ID: Madison Fernandez, female    DOB: 2001-04-08  Age: 20 y.o. MRN: [id number]  CC:  Chief Complaint  Patient presents with   Follow-up    HPI Madison Fernandez is a 20 year old female with history of endometriosis, prediabetes, and elevated liver enzymes who presents for routine follow up. She was seen by Gastroenterology  Dr Allen Norris on 08/14/21  and he ordered an ultrasound and has tried to contact to follow up on her iron saturation of 7%. She takes metformin daily and denies side effects or missed doses. She denies polydipsia, polyphagia, polyuria. Her A1C on 07/31/21 was 5.8%. She still has her nexplanon implant and reports light spotting this weekend. She has not had a screening pap smear. She is sexually active and is interested in STI screening even though she is asymptomatic. Her constipation and cough from the last office visit have improved, although she still has night time cough.  Overall, she reports dysphoric mood. She states that she finds it difficult to get out of bed some days and often has crying spells. She reports a history of depression during her teens which was treated with zoloft and counseling, both of which she stopped when she lost insurance coverage. She denies suicidal ideation or intent and is interested in counseling at [facility]. She states that she feels overwhelmed in general, but is doing the best she can and is ready to start feeling better.  Past Medical History:  Diagnosis Date   Endometriosis    Endometriosis 01/2018   Ovarian cyst 01/2018    Past Surgical History:  Procedure Laterality Date   NO PAST SURGERIES      Family History  Problem Relation Age of Onset   Asthma Mother    Heart disease Mother    Diabetes Mother    Hypertension Mother    Diverticulosis Mother    Diabetes Father        pre diabetes   Hyperlipidemia Father    Hypertension Father    Asthma Brother    Diabetes Brother     Ovarian cancer Neg Hx    Colon cancer Neg Hx    Breast cancer Neg Hx     Social History   Socioeconomic History   Marital status: Single    Spouse name: Not on file   Number of children: Not on file   Years of education: Not on file   Highest education level: Not on file  Occupational History   Not on file  Tobacco Use   Smoking status: Never   Smokeless tobacco: Never  Vaping Use   Vaping Use: Never used  Substance and Sexual Activity   Alcohol use: Yes    Comment: social   Drug use: No   Sexual activity: Not Currently    Birth control/protection: Implant    Comment: Nexplanon  Other Topics Concern   Not on file  Social History Narrative   Not on file   Social Determinants of Health   Financial Resource Strain: Not on file  Food Insecurity: No Food Insecurity   Worried About Running Out of Food in the Last Year: Never true   Ran Out of Food in the Last Year: Never true  Transportation Needs: No Transportation Needs   Lack of Transportation (Medical): No   Lack of Transportation (Non-Medical): No  Physical Activity: Not on file  Stress: Not on file  Social Connections: Not on file  Intimate Partner Violence: Not on file    Outpatient Medications Prior to Visit  Medication Sig Dispense Refill   metFORMIN (GLUCOPHAGE) 1000 MG tablet Take (1/2) tablet (500 mg total) by mouth once daily with breakfast. 30 tablet 4   benzonatate (TESSALON PERLES) 100 MG capsule Take 1 capsule (100 mg total) by mouth 3 (three) times daily as needed for cough. (Patient not taking: Reported on 09/23/2021) 20 capsule 0   No facility-administered medications prior to visit.    Allergies  Allergen Reactions   Sulfa Antibiotics Rash    ROS Review of Systems  Constitutional:  Positive for fatigue.  HENT:  Negative for congestion, rhinorrhea, sneezing and trouble swallowing.   Respiratory:  Positive for cough (nighttime) and shortness of breath (with exertion). Negative for chest  tightness and wheezing.   Cardiovascular:  Negative for chest pain.  Gastrointestinal:  Negative for abdominal pain, blood in stool, constipation and nausea.  Endocrine: Negative for polydipsia, polyphagia and polyuria.  Genitourinary:  Negative for dysuria, genital sores, menstrual problem and vaginal discharge.  Skin: Negative.   Neurological:  Negative for dizziness and syncope.  Psychiatric/Behavioral:  Positive for dysphoric mood. The patient is nervous/anxious.      Objective:    Physical Exam Vitals and nursing note reviewed.  Constitutional:      General: She is not in acute distress.    Appearance: She is not ill-appearing.  HENT:     Mouth/Throat:     Pharynx: No oropharyngeal exudate or posterior oropharyngeal erythema.  Cardiovascular:     Rate and Rhythm: Normal rate and regular rhythm.     Pulses: Normal pulses.     Heart sounds: Normal heart sounds. No murmur heard.   No friction rub. No gallop.  Pulmonary:     Effort: Pulmonary effort is normal. No respiratory distress.     Breath sounds: Normal breath sounds. No stridor. No wheezing, rhonchi or rales.  Abdominal:     General: Abdomen is flat. Bowel sounds are normal.     Palpations: Abdomen is soft.     Tenderness: There is no abdominal tenderness. There is no guarding.  Skin:    General: Skin is warm and dry.     Capillary Refill: Capillary refill takes less than 2 seconds.  Neurological:     General: No focal deficit present.     Mental Status: She is alert and oriented to person, place, and time.  Psychiatric:        Attention and Perception: Attention normal.        Mood and Affect: Mood is anxious and depressed. Affect is tearful.        Speech: Speech normal.        Behavior: Behavior is cooperative.        Thought Content: Thought content normal.    BP 111/75 (BP Location: Left Arm, Patient Position: Sitting, Cuff Size: Normal)   Pulse 84   Temp 97.9 F (36.6 C) (Oral)   Resp 16   Ht _0   (1.499 m)   Wt 153 lb 1.6 oz (69.4 kg)   LMP 09/19/2021 (Exact Date) Comment: Nexplanon  SpO2 96%   BMI 30.92 kg/m  Wt Readings from Last 3 Encounters:  09/23/21 153 lb 1.6 oz (69.4 kg)  08/19/21 152 lb 12.8 oz (69.3 kg)  08/14/21 152 lb 12.8 oz (69.3 kg)     Health Maintenance Due  Topic Date Due   COVID-19 Vaccine (1) Never done   Pneumococcal Vaccine  75-33 Years old (1 - PCV) Never done   HPV VACCINES (1 - 2-dose series) Never done   HIV Screening  Never done   TETANUS/TDAP  Never done   PAP-Cervical Cytology Screening  Never done   PAP SMEAR-Modifier  Never done   INFLUENZA VACCINE  Never done       Topic Date Due   HPV VACCINES (1 - 2-dose series) Never done    Lab Results  Component Value Date   TSH 1.040 02/20/2021   Lab Results  Component Value Date   WBC 13.7 (H) 11/25/2020   HGB 14.0 11/25/2020   HCT 42.4 11/25/2020   MCV 88.5 11/25/2020   PLT 328 11/25/2020   Lab Results  Component Value Date   NA 144 02/20/2021   K 4.6 02/20/2021   CO2 20 02/20/2021   GLUCOSE 88 02/20/2021   BUN 6 02/20/2021   CREATININE 0.64 02/20/2021   BILITOT <0.2 08/14/2021   ALKPHOS 119 08/14/2021   AST 21 08/14/2021   ALT 27 08/14/2021   PROT 7.1 08/14/2021   ALBUMIN 4.6 08/14/2021   CALCIUM 9.8 02/20/2021   ANIONGAP 12 11/25/2020   EGFR 129 02/20/2021   Lab Results  Component Value Date   CHOL 166 02/20/2021   Lab Results  Component Value Date   HDL 50 02/20/2021   Lab Results  Component Value Date   LDLCALC 97 02/20/2021   Lab Results  Component Value Date   TRIG 106 02/20/2021   No results found for: [facility] Lab Results  Component Value Date   HGBA1C 5.8 (H) 07/31/2021      Assessment & Plan:   1. Prediabetes - advised to follow low sugar/non-sweetened diet and continue her metformin daily. Will recheck A1C at her next visit.  2. Elevated liver enzymes - Advised her to contact Dr Dorothey Baseman office regarding her low iron saturation and to call  radiology scheduling for her abdominal ultrasound.   3. Cough, unspecified type - lingering non-productive nighttime cough. Will send in [place] and advised her to contact clinic or go to the ED if any of her respiratory symptoms worsen. - benzonatate (TESSALON PERLES) 100 MG capsule; Take 1 capsule (100 mg total) by mouth 3 (three) times daily as needed for cough.  Dispense: 20 capsule; Refill: 0  4. Dysphoric mood - She is advised to schedule counseling appointment with Heather at [facility]. She is advised to contact crisis helpline or go to the ED with worsening symptoms.  5. Preventative health care - She will contact the health department regarding her nexplanon and to schedule a screening pap smear with STI testing. She is encouraged to use condoms with all sexual encounters.   Follow-up: Return in about 10 weeks (around 12/02/2021), or if symptoms worsen or fail to improve.    Harvin Hazel, RN

## 2021-09-24 ENCOUNTER — Institutional Professional Consult (permissible substitution): Payer: Medicaid Other | Admitting: Licensed Clinical Social Worker

## 2021-09-24 ENCOUNTER — Telehealth: Payer: Self-pay | Admitting: Licensed Clinical Social Worker

## 2021-09-24 ENCOUNTER — Ambulatory Visit: Payer: Self-pay | Admitting: Licensed Clinical Social Worker

## 2021-09-24 ENCOUNTER — Other Ambulatory Visit: Payer: Self-pay

## 2021-09-24 DIAGNOSIS — F331 Major depressive disorder, recurrent, moderate: Secondary | ICD-10-CM

## 2021-09-24 DIAGNOSIS — F411 Generalized anxiety disorder: Secondary | ICD-10-CM
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
## 2021-09-24 NOTE — Telephone Encounter (Signed)
Spoke to patient and rescheduled her IBH-Consult Appointment.

## 2021-10-01 ENCOUNTER — Other Ambulatory Visit: Payer: Self-pay | Admitting: Gerontology

## 2021-10-01 ENCOUNTER — Other Ambulatory Visit: Payer: Self-pay

## 2021-10-01 ENCOUNTER — Institutional Professional Consult (permissible substitution): Payer: Medicaid Other | Admitting: Licensed Clinical Social Worker

## 2021-10-01 DIAGNOSIS — F411 Generalized anxiety disorder: Secondary | ICD-10-CM

## 2021-10-01 MED ORDER — SERTRALINE HCL 50 MG PO TABS
50.0000 mg | ORAL_TABLET | Freq: Every day | ORAL | 0 refills | Status: DC
  Filled 2021-10-01: qty 30, 30d supply, fill #0

## 2021-10-02 ENCOUNTER — Ambulatory Visit: Payer: Self-pay | Admitting: Licensed Clinical Social Worker

## 2021-10-02 ENCOUNTER — Other Ambulatory Visit: Payer: Self-pay

## 2021-10-02 ENCOUNTER — Ambulatory Visit: Payer: Medicaid Other | Admitting: Licensed Clinical Social Worker

## 2021-10-02 ENCOUNTER — Ambulatory Visit
Admission: RE | Admit: 2021-10-02 | Discharge: 2021-10-02 | Disposition: A | Discharge status: Home / Self Care | Payer: Self-pay | Source: Ambulatory Visit | Attending: Gastroenterology | Admitting: Gastroenterology

## 2021-10-02 DIAGNOSIS — R748 Abnormal levels of other serum enzymes: Secondary | ICD-10-CM | POA: Insufficient documentation

## 2021-10-02 DIAGNOSIS — F411 Generalized anxiety disorder: Secondary | ICD-10-CM

## 2021-10-02 DIAGNOSIS — F331 Major depressive disorder, recurrent, moderate: Secondary | ICD-10-CM

## 2021-10-02 NOTE — BH Specialist Note (Signed)
Integrated Behavioral Health Follow Up In-Person Visit  MRN: [id number] Name: Madison Fernandez  Total time: 60 minutes  Types of Service: Telephone visit  Interpretor:No. Interpretor Name and Language: N/A  Subjective: ESTELENE CARMACK is a 20 y.o. female accompanied by  herself Patient was referred by Carlyon Shadow for mental health. Patient reports the following symptoms/concerns: The patient reports that she has been doing the same since her last follow up appointment. She noted that she understood the risks of psychotropic medications and was agreeable to the plan and had no questions. She discussed her work schedule and noted that Tueday afternoons were best for her at this time.  Duration of problem: Years; Severity of problem: moderate  Objective: Mood: Euthymic and Affect: Appropriate Risk of harm to self or others: No plan to harm self or others  Life Context: Family and Social: see above School/Work: see above Self-Care: see above Life Changes: see above  Patient and/or Family's Strengths/Protective Factors: Social connections, Concrete supports in place (healthy food, safe environments, etc.), and Sense of purpose  Goals Addressed: Patient will:  Reduce symptoms of: agitation, anxiety, depression, insomnia, and stress   Increase knowledge and/or ability of: coping skills, healthy habits, self-management skills, and stress reduction   Demonstrate ability to: Increase healthy adjustment to current life circumstances and Increase adequate support systems for patient/family  Progress towards Goals: Ongoing  Interventions: Interventions utilized:  CBT Cognitive Behavioral Therapy was utilized by the clinician during today's follow up session. Clinician met with patient to identify needs related to stressors and functioning, and assess and monitor for signs and symptoms of anxiety and depression, and assess safety. The clinician processed with the patient how they  have been doing since the last follow-up session. Clinician utilized guidance and supervision, provided by psychiatric consultant, to inform patient of the benefits and risks of psychotropic medication use including a verbal summary of Black Box warnings. Patient was given the opportunity to ask questions and address concerns regarding the psychiatric consultants recommendations for treatment. Session ended with scheduling.  Standardized Assessments completed: GAD-7 and PHQ 9 GAD-7 = 18 PHQ-9 = 17  Assessment: Patient currently experiencing see above.   Patient may benefit from see above.  Plan: Follow up with behavioral health clinician on : 10/07/2021 at 5:30 pm  Behavioral recommendations:  Referral(s): [place] (In Clinic) "From scale of 1-10, how likely are you to follow plan?":   Lesli Albee, LCSWA

## 2021-10-07 ENCOUNTER — Other Ambulatory Visit: Payer: Self-pay

## 2021-10-07 ENCOUNTER — Telehealth: Payer: Self-pay

## 2021-10-07 ENCOUNTER — Ambulatory Visit: Payer: Self-pay | Admitting: Licensed Clinical Social Worker

## 2021-10-07 ENCOUNTER — Ambulatory Visit: Payer: Medicaid Other | Admitting: Licensed Clinical Social Worker

## 2021-10-07 DIAGNOSIS — F411 Generalized anxiety disorder: Secondary | ICD-10-CM

## 2021-10-07 DIAGNOSIS — F331 Major depressive disorder, recurrent, moderate: Secondary | ICD-10-CM

## 2021-10-07 NOTE — Telephone Encounter (Signed)
-----   Message from Midge Minium, MD sent at 10/07/2021  8:44 AM EST ----- Please let the patient know that the ultrasound was normal.

## 2021-10-07 NOTE — BH Specialist Note (Signed)
Integrated Behavioral Health Follow Up Telephone Visit  MRN: [id number] Name: Madison Fernandez  Total time: 30 minutes  Types of Service: Telephone visit Patient consents to telephone visit and 2 patient identifiers were used to identify patient   Interpretor:No. Interpretor Name and Language: N/A.   Subjective: Madison Fernandez is a 20 y.o. female accompanied by  herself  Patient was referred by Carlyon Shadow, NP for mental health. Patient reports the following symptoms/concerns: The patient reports that she has been doing about the same since her last follow-up appointment. She noted that she has not picked up her prescription of Sertraline from the pharmacy yet, but plans to do so this week. She stated that she would be okay working with a Secretary/administrator over the next few weeks. Madison Fernandez noted that she did not have any questions about her medication or treatment plan at this time but may have questions later. The patient noted she was at work currently. Ethylene denied any suicidal or homicidal thoughts.  Duration of problem: Years; Severity of problem: moderate  Objective: Mood: Euthymic and Affect: Appropriate Risk of harm to self or others: No plan to harm self or others  Life Context: Family and Social: see above School/Work: see above Self-Care: see above Life Changes: see above   Patient and/or Family's Strengths/Protective Factors: Concrete supports in place (healthy food, safe environments, etc.) and Sense of purpose  Goals Addressed: Patient will:  Reduce symptoms of: anxiety, depression, and stress   Increase knowledge and/or ability of: coping skills, healthy habits, self-management skills, and stress reduction   Demonstrate ability to: Increase healthy adjustment to current life circumstances and Increase adequate support systems for patient/family  Progress towards Goals: Ongoing  Interventions: Interventions utilized:  Supportive Counseling was utilized  by the clinician during today's follow up session. Clinician met with patient to identify needs related to stressors and functioning, and assess and monitor for signs and symptoms of anxiety and depression, and assess safety. The clinician processed with the patient how they have been doing since the last follow-up session. Clinician introduced the MSW social work intern and processed with the patient if she would feel comfortable meeting weekly. Clinician checked in with the patient regarding her psychotropic medications and offered space for the patient to address any questions or concerns she had. Clinician ended the session by informing the patient that he social work intern would reach out to her to schedule her next follow-up appointment.  Standardized Assessments completed: GAD-7 and PHQ 9 PHQ-9 = 17 GAD-7 = 18  Assessment: Patient currently experiencing see above.   Patient may benefit from see above.  Plan: Follow up with behavioral health clinician on : TBD  Behavioral recommendations:  Referral(s): [facility] (In Clinic) "From scale of 1-10, how likely are you to follow plan?":   Lesli Albee, LCSWA

## 2021-10-07 NOTE — Telephone Encounter (Signed)
Pt notified of US results through MyChart.  

## 2021-10-09 ENCOUNTER — Telehealth: Payer: Self-pay | Admitting: Licensed Clinical Social Worker

## 2021-10-09 NOTE — Telephone Encounter (Signed)
Left patient a voicemail to set up an in-person IBH follow-up appointment in the beginning of January.

## 2021-12-02 ENCOUNTER — Ambulatory Visit: Payer: Medicaid Other | Admitting: Gerontology

## 2021-12-17 ENCOUNTER — Encounter (LOCAL_COMMUNITY_HEALTH_CENTER): Payer: Medicaid Other | Admitting: Family Medicine

## 2021-12-17 ENCOUNTER — Ambulatory Visit (LOCAL_COMMUNITY_HEALTH_CENTER): Payer: Medicaid Other | Admitting: Family Medicine

## 2021-12-17 ENCOUNTER — Encounter: Payer: Self-pay | Admitting: Family Medicine

## 2021-12-17 ENCOUNTER — Other Ambulatory Visit: Payer: Self-pay

## 2021-12-17 VITALS — BP 133/85 | Wt 159.0 lb

## 2021-12-17 DIAGNOSIS — Z1272 Encounter for screening for malignant neoplasm of vagina: Secondary | ICD-10-CM

## 2021-12-17 DIAGNOSIS — Z3202 Encounter for pregnancy test, result negative: Secondary | ICD-10-CM

## 2021-12-17 DIAGNOSIS — Z01419 Encounter for gynecological examination (general) (routine) without abnormal findings: Secondary | ICD-10-CM | POA: Diagnosis not present

## 2021-12-17 DIAGNOSIS — Z309 Encounter for contraceptive management, unspecified: Secondary | ICD-10-CM

## 2021-12-17 DIAGNOSIS — Z113 Encounter for screening for infections with a predominantly sexual mode of transmission: Secondary | ICD-10-CM

## 2021-12-17 DIAGNOSIS — Z3046 Encounter for surveillance of implantable subdermal contraceptive: Secondary | ICD-10-CM | POA: Diagnosis not present

## 2021-12-17 LAB — PREGNANCY, URINE: Preg Test, Ur: NEGATIVE

## 2021-12-17 LAB — WET PREP FOR TRICH, YEAST, CLUE
Trichomonas Exam: NEGATIVE
Yeast Exam: NEGATIVE

## 2021-12-17 LAB — HM HIV SCREENING LAB: HM HIV Screening: NEGATIVE

## 2021-12-17 NOTE — Progress Notes (Signed)
Patient seen today for PE,PAP,STD, pregnancy test and Nexplanon removal. Condoms  given. PCP list given. Wet prep reviewed, no tx per standing orders.

## 2021-12-18 NOTE — Progress Notes (Signed)
See other note with same date.   Yaileen Hofferber, FNP  

## 2021-12-18 NOTE — Progress Notes (Signed)
[facility] DEPARTMENT [facility] [address] Main Number: [phone]    Family Planning Visit- Initial Visit  Subjective:  Madison Fernandez is a 21 y.o.  [id number]   being seen today for an initial annual visit and to discuss reproductive life planning.  The patient is currently using Hormonal Implant for pregnancy prevention. Patient reports   does not want a pregnancy in the next year.  Patient has the following medical conditions has Dysmenorrhea; Polycystic ovaries; Endometriosis; Dyspareunia, female; Prediabetes; Elevated liver enzymes; Cough; Constipation; Tachycardia; and Dysphoric mood on their problem list.  Chief Complaint  Patient presents with   Annual Exam    Patient reports here for physical, pap and nexplanon removal   Patient denies any problems or concerns    Body mass index is 32.11 kg/m. - Patient is eligible for diabetes screening based on BMI and age >30?  not applicable HA1C ordered? not applicable  Patient reports 1  partner/s in last year. Desires STI screening?  Yes  Has patient been screened once for HCV in the past?  No  No results found for: HCVAB  Does the patient have current drug use (including MJ), have a partner with drug use, and/or has been incarcerated since last result? No  If yes-- Screen for HCV through [facility]   Does the patient meet criteria for HBV testing? No  Criteria:  -Household, sexual or needle sharing contact with HBV -History of drug use -HIV positive -Those with known Hep C   Health Maintenance Due  Topic Date Due   COVID-19 Vaccine (1) Never done   HPV VACCINES (1 - 2-dose series) Never done   HIV Screening  Never done   TETANUS/TDAP  Never done   PAP-Cervical Cytology Screening  Never done   PAP SMEAR-Modifier  Never done   INFLUENZA VACCINE  Never done    Review of Systems  Constitutional:  Negative for chills, fever, malaise/fatigue and weight loss.  HENT:   Negative for congestion, hearing loss and sore throat.   Eyes:  Negative for blurred vision, double vision and photophobia.  Respiratory:  Negative for shortness of breath.   Cardiovascular:  Negative for chest pain.  Gastrointestinal:  Negative for abdominal pain, blood in stool, constipation, diarrhea, heartburn, nausea and vomiting.  Genitourinary:  Negative for dysuria and frequency.  Musculoskeletal:  Negative for back pain, joint pain and neck pain.  Skin:  Negative for itching and rash.  Neurological:  Negative for dizziness, weakness and headaches.  Endo/Heme/Allergies:  Does not bruise/bleed easily.  Psychiatric/Behavioral:  Negative for depression, substance abuse and suicidal ideas.    The following portions of the patient's history were reviewed and updated as appropriate: allergies, current medications, past family history, past medical history, past social history, past surgical history and problem list. Problem list updated.   See flowsheet for other program required questions.  Objective:   Vitals:   12/17/21 1319  BP: 133/85  Weight: 159 lb (72.1 kg)    Physical Exam Vitals and nursing note reviewed.  Constitutional:      Appearance: Normal appearance.  HENT:     Head: Normocephalic and atraumatic.     Mouth/Throat:     Mouth: Mucous membranes are moist.     Pharynx: No oropharyngeal exudate or posterior oropharyngeal erythema.  Eyes:     General: No scleral icterus. Cardiovascular:     Rate and Rhythm: Normal rate and regular rhythm.     Pulses:  Normal pulses.     Heart sounds: Normal heart sounds.  Pulmonary:     Effort: Pulmonary effort is normal.     Breath sounds: Normal breath sounds.  Abdominal:     General: Abdomen is flat. Bowel sounds are normal.     Palpations: Abdomen is soft.  Genitourinary:    General: Normal vulva.     Rectum: Normal.  Musculoskeletal:        General: Normal range of motion.     Cervical back: Normal range of motion  and neck supple.  Skin:    General: Skin is warm and dry.  Neurological:     General: No focal deficit present.     Mental Status: She is alert and oriented to person, place, and time.  Psychiatric:        Mood and Affect: Mood normal.        Behavior: Behavior normal.      Assessment and Plan:  Madison Fernandez is a 21 y.o. female presenting to the [facility] Department for an initial annual wellness/contraceptive visit  Contraception counseling: Reviewed all forms of birth control options in the tiered based approach. available including abstinence; over the counter/barrier methods; hormonal contraceptive medication including pill, patch, ring, injection,contraceptive implant, ECP; hormonal and nonhormonal IUDs; permanent sterilization options including vasectomy and the various tubal sterilization modalities. Risks, benefits, and typical effectiveness rates were reviewed.  Questions were answered.  Written information was also given to the patient to review.  Patient desires No Method - No Contraceptive Precautions, this was prescribed for patient.    The patient will follow up in  as needed for surveillance.  The patient was told to call with any further questions, or with any concerns about this method of contraception.  Emphasized use of condoms 100% of the time for STI prevention.  Patient was not  offered ECP based on last sex   1. Smear, vaginal, as part of routine gynecological examination Well woman exam  CBE today  Pap today   - Pap IG (Image Guided)  2. Screening examination for venereal disease Pt screened  - HIV [facility] - Syphilis Serology, [facility] - Pregnancy, urine - Chlamydia/Gonorrhea [facility] - WET PREP FOR TRICH, YEAST, CLUE  3. Nexplanon removal Patient identified, informed consent performed, consent signed.   Appropriate time out taken. Nexplanon site identified.  Area prepped in usual sterile fashon. 3 ml of 1% lidocaine with  Epinephrine was used to anesthetize the area at the distal end of the implant and along implant site. A small stab incision was made right beside the implant on the distal portion.  The Nexplanon rod was grasped using manual and removed without difficulty.  There was minimal blood loss. There were no complications.  Steri-strips were applied over the small incision.  A pressure bandage was applied to reduce any bruising.  The patient tolerated the procedure well and was given post procedure instructions.    Counseled patient to take OTC analgesic starting as soon as lidocaine starts to wear off and take regularly for at least 48 hr to decrease discomfort.  Specifically to take with food or milk to decrease stomach upset and for IB 600 mg (3 tablets) every 6 hrs; IB 800 mg (4 tablets) every 8 hrs; or Aleve 2 tablets every 12 hrs.    No follow-ups on file.  No future appointments.  Wendi Snipes, FNP

## 2021-12-20 LAB — PAP IG (IMAGE GUIDED): PAP Smear Comment: 0

## 2022-01-08 ENCOUNTER — Ambulatory Visit: Payer: Medicaid Other | Admitting: Gerontology

## 2022-02-11 ENCOUNTER — Telehealth: Payer: Self-pay | Admitting: Pharmacy Technician

## 2022-02-11 NOTE — Telephone Encounter (Signed)
Received updated proof of income.  Patient eligible to receive medication assistance at [facility] until time for re-certification in 2024, and as long as eligibility requirements continue to be met. ? ?Madison Fernandez J. [name] ?Care Manager ?[facility]  ?

## 2022-02-26 ENCOUNTER — Other Ambulatory Visit: Payer: Self-pay

## 2022-03-20 ENCOUNTER — Ambulatory Visit (LOCAL_COMMUNITY_HEALTH_CENTER): Payer: Medicaid Other | Admitting: Family Medicine

## 2022-03-20 VITALS — BP 131/85 | Ht 59.0 in | Wt 162.2 lb

## 2022-03-20 DIAGNOSIS — Z3200 Encounter for pregnancy test, result unknown: Secondary | ICD-10-CM

## 2022-03-20 DIAGNOSIS — Z3009 Encounter for other general counseling and advice on contraception: Secondary | ICD-10-CM

## 2022-03-20 DIAGNOSIS — Z30017 Encounter for initial prescription of implantable subdermal contraceptive: Secondary | ICD-10-CM

## 2022-03-20 DIAGNOSIS — Z3202 Encounter for pregnancy test, result negative: Secondary | ICD-10-CM | POA: Diagnosis not present

## 2022-03-20 LAB — PREGNANCY, URINE: Preg Test, Ur: NEGATIVE

## 2022-03-20 MED ORDER — ETONOGESTREL 68 MG ~~LOC~~ IMPL
68.0000 mg | DRUG_IMPLANT | Freq: Once | SUBCUTANEOUS | Status: AC
  Administered 2022-03-20: 68 mg via SUBCUTANEOUS

## 2022-03-20 NOTE — Progress Notes (Signed)
S: Pt in clinic for Nexplanon insertion. Last sex was 02/09/2022  O: PE done on 12/17/21, PT - negative   A: 1. Encounter for pregnancy test, result unknown - Pregnancy, urine  2. Encounter for initial prescription of implantable subdermal contraceptive - etonogestrel (NEXPLANON) implant 68 mg  P: Nexplanon Insertion Procedure Patient identified, informed consent performed, consent signed.   Patient does understand that irregular bleeding is a very common side effect of this medication. She was advised to have backup contraception after placement. Patient was determined to meet WHO criteria for not being pregnant. Appropriate time out taken.  The insertion site was identified 8-10 cm (3-4 inches) from the medial epicondyle of the humerus and 3-5 cm (1.25-2 inches) posterior to (below) the sulcus (groove) between the biceps and triceps muscles of the patient's Left  arm and marked.  Patient was prepped with alcohol swab and then injected with 3 ml of 1% lidocaine.  Arm was prepped with chlorhexidene, Nexplanon removed from packaging,  Device confirmed in needle, then inserted full length of needle and withdrawn per handbook instructions. Nexplanon was able to palpated in the patient's arm; patient palpated the insert herself. There was minimal blood loss.  Patient insertion site covered with guaze and a pressure bandage to reduce any bruising.  The patient tolerated the procedure well and was given post procedure instructions.    Counseled patient to take OTC analgesic starting as soon as lidocaine starts to wear off and take regularly for at least 48 hr to decrease discomfort.  Specifically to take with food or milk to decrease stomach upset and for IB 600 mg (3 tablets) every 6 hrs; IB 800 mg (4 tablets) every 8 hrs; or Aleve 2 tablets every 12 hrs.    Wendi Snipes, FNP

## 2022-03-20 NOTE — Progress Notes (Signed)
Pt here for Nexplanon insertion.  Pt notified of negative UPT.  Instructions and consent form, for Nexplanon, given.  Nexplanon placed by Provider without any complications.  TANF Nexplanon used.  Condoms declined.  Berdie Ogren, RN

## 2022-05-08 ENCOUNTER — Other Ambulatory Visit: Payer: Self-pay

## 2022-05-08 ENCOUNTER — Other Ambulatory Visit: Payer: Self-pay | Admitting: Gerontology

## 2022-05-08 DIAGNOSIS — R7303 Prediabetes: Secondary | ICD-10-CM

## 2022-05-08 DIAGNOSIS — F411 Generalized anxiety disorder: Secondary | ICD-10-CM

## 2022-05-12 ENCOUNTER — Other Ambulatory Visit: Payer: Self-pay

## 2022-05-26 ENCOUNTER — Ambulatory Visit (INDEPENDENT_AMBULATORY_CARE_PROVIDER_SITE_OTHER): Payer: Self-pay | Admitting: Gastroenterology

## 2022-05-26 ENCOUNTER — Encounter: Payer: Self-pay | Admitting: Gastroenterology

## 2022-05-26 VITALS — BP 115/79 | HR 94 | Temp 98.6°F | Wt 162.0 lb

## 2022-05-26 DIAGNOSIS — Z23 Encounter for immunization: Secondary | ICD-10-CM

## 2022-05-26 DIAGNOSIS — R748 Abnormal levels of other serum enzymes: Secondary | ICD-10-CM

## 2022-05-26 NOTE — Addendum Note (Signed)
Addended by: Roena Malady on: 05/26/2022 06:19 PM   Modules accepted: Orders

## 2022-05-26 NOTE — Progress Notes (Signed)
    Primary Care Physician: Pcp, No  Primary Gastroenterologist:  Dr. Midge Minium  Chief Complaint  Patient presents with   Follow-up    HPI: Madison Fernandez is a 21 y.o. female here for follow-up with a history of abnormal liver enzymes.  The patient was found to be susceptible to hepatitis A and hepatitis B but never had vaccinations.  The patient's liver enzymes 9 months ago had returned back to normal.  The patient does state that she has gained approximate 10 pounds since last seeing me.  There is no report of any unexplained weight loss fevers chills nausea vomiting black stools or bloody stools.  She is having no issues at the present time.  She is following up because she states she missed her last appointment.  The patient had a normal right upper quadrant ultrasound.  Past Medical History:  Diagnosis Date   Endometriosis    Endometriosis 01/2018   Endometriosis 2020   Ovarian cyst 01/2018   Prediabetes 2022    Current Outpatient Medications  Medication Sig Dispense Refill   etonogestrel (NEXPLANON) 68 MG IMPL implant 1 each by Subdermal route once.     metFORMIN (GLUCOPHAGE) 1000 MG tablet Take (1/2) tablet (500 mg total) by mouth once daily with breakfast. 30 tablet 4   sertraline (ZOLOFT) 50 MG tablet Take 1 tablet (50 mg total) by mouth once daily. 30 tablet 0   No current facility-administered medications for this visit.    Allergies as of 05/26/2022 - Review Complete 05/26/2022  Allergen Reaction Noted   Sulfa antibiotics Rash 02/20/2021    ROS:  General: Negative for anorexia, weight loss, fever, chills, fatigue, weakness. ENT: Negative for hoarseness, difficulty swallowing , nasal congestion. CV: Negative for chest pain, angina, palpitations, dyspnea on exertion, peripheral edema.  Respiratory: Negative for dyspnea at rest, dyspnea on exertion, cough, sputum, wheezing.  GI: See history of present illness. GU:  Negative for dysuria, hematuria, urinary  incontinence, urinary frequency, nocturnal urination.  Endo: Negative for unusual weight change.    Physical Examination:   There were no vitals taken for this visit.  General: Well-nourished, well-developed in no acute distress.  Eyes: No icterus. Conjunctivae pink. Neuro: Alert and oriented x 3.  Grossly intact. Skin: Warm and dry, no jaundice.   Psych: Alert and cooperative, normal mood and affect.  Labs:    Imaging Studies: No results found.  Assessment and Plan:   Madison Fernandez is a 21 y.o. y/o female who comes in today with a history of abnormal liver enzymes that did return to normal.  The patient has been encouraged to undergo vaccinations for hepatitis A and hepatitis B.  The patient will also have her liver enzymes checked today.  The patient has been explained the plan and agrees with it.     Midge Minium, MD. Clementeen Graham    Note: This dictation was prepared with Dragon dictation along with smaller phrase technology. Any transcriptional errors that result from this process are unintentional.

## 2022-05-27 LAB — HEPATIC FUNCTION PANEL
ALT: 21 IU/L (ref 0–32)
AST: 18 IU/L (ref 0–40)
Albumin: 4.4 g/dL (ref 4.0–5.0)
Alkaline Phosphatase: 114 IU/L (ref 44–121)
Bilirubin Total: 0.4 mg/dL (ref 0.0–1.2)
Bilirubin, Direct: <0.1 mg/dL (ref 0.00–0.40)
Total Protein: 7 g/dL (ref 6.0–8.5)

## 2022-05-28 ENCOUNTER — Other Ambulatory Visit: Payer: Self-pay

## 2022-05-28 ENCOUNTER — Ambulatory Visit: Payer: Medicaid Other | Admitting: Family Medicine

## 2022-05-28 ENCOUNTER — Encounter: Payer: Self-pay | Admitting: Gastroenterology

## 2022-05-28 VITALS — BP 121/78 | HR 110 | Temp 98.6°F | Ht 60.0 in | Wt 162.0 lb

## 2022-05-28 DIAGNOSIS — F411 Generalized anxiety disorder: Secondary | ICD-10-CM

## 2022-05-28 DIAGNOSIS — R000 Tachycardia, unspecified: Secondary | ICD-10-CM

## 2022-05-28 DIAGNOSIS — R7303 Prediabetes: Secondary | ICD-10-CM

## 2022-05-28 DIAGNOSIS — R4589 Other symptoms and signs involving emotional state: Secondary | ICD-10-CM

## 2022-05-28 MED ORDER — SERTRALINE HCL 50 MG PO TABS
50.0000 mg | ORAL_TABLET | Freq: Every day | ORAL | 1 refills | Status: AC
  Filled 2022-05-28 – 2022-06-25 (×2): qty 30, 30d supply, fill #0

## 2022-05-28 NOTE — Progress Notes (Signed)
   Established Patient Office Visit  Subjective   Patient ID: Madison Fernandez, female    DOB: 2000/11/04  Age: 21 y.o. MRN: [id number]  Chief Complaint  Patient presents with   Medication Refill    Check up and medication refill of Metformin and Zoloft. Pt has noticed being extra cold and weight gain. Wants to discuss with doctor.     HPI:  [place] and ran out of her previous medications.  She had Nexplanon removed about 6 months ago but it was replace on 03/20/22.  She was on metformin for what she says was pre-diabetes.  She has had elevated A1c in the past, 5.8 on 07/31/21.  She has no increased thirst or urination.  Seems to have a history of PCOS and endometriosis.  Is followed gy Gyn.  She was on Zoloft 50 mg daily for anxiety and depression symptoms.  She ran out about 40month ago and stared feeling more anxious and down a few months ago.  She has difficulty sleeping.  Eating has been worse. Has put on some weight and has been more constipated, though it's possible that could be related to stopping Metformin which she said made her have more BM's.    ROS    Objective:     BP 121/78 (BP Location: Left Arm, Patient Position: Sitting, Cuff Size: Normal)   Pulse (!) 110   Temp 98.6 F (37 C) (Oral)   Ht 5' (1.524 m)   Wt 162 lb (73.5 kg)   LMP  (LMP Unknown) Comment: Last period in middle June for approximately 4 days. Took pregnancy test beginning of July but pt states it didn't work.  SpO2 97%   BMI 31.64 kg/m    Physical Exam  OP/conj clear CTA RRR NT/ND   No results found for any visits on 05/28/22.    The ASCVD Risk score (Arnett DK, et al., 2019) failed to calculate for the following reasons:   The 2019 ASCVD risk score is only valid for ages 429to 752   Assessment & Plan:   Problem List Items Addressed This Visit       Other   Prediabetes - Primary   Discussed reducing carbs and sweets and exercising more, with goal of  losing weight.  Hold off on restarting Metformin for now.   HgB A1c   Comp Met (CMET)   Dysphoric mood   Restart Zoloft 50 mg daily.     CBC w/Diff   Comp Met (CMET)   TSH   RESOLVED: Tachycardia   Other Visit Diagnoses     Generalized anxiety disorder       Relevant Medications   sertraline (ZOLOFT) 50 MG tablet       Return in about 4 weeks (around 06/25/2022).    DJuluis Pitch MD

## 2022-05-28 NOTE — Patient Instructions (Signed)
Start Zoloft 50 mg once a day.   Work on reducing sugar and carbs in your diet and exercising more.

## 2022-05-29 ENCOUNTER — Other Ambulatory Visit: Payer: Self-pay

## 2022-05-29 LAB — COMPREHENSIVE METABOLIC PANEL
ALT: 29 IU/L (ref 0–32)
AST: 26 IU/L (ref 0–40)
Albumin/Globulin Ratio: 2.1 (ref 1.2–2.2)
Albumin: 4.9 g/dL (ref 4.0–5.0)
Alkaline Phosphatase: 124 IU/L — ABNORMAL HIGH (ref 44–121)
BUN/Creatinine Ratio: 15 (ref 9–23)
BUN: 10 mg/dL (ref 6–20)
Bilirubin Total: <0.2 mg/dL (ref 0.0–1.2)
CO2: 18 mmol/L — ABNORMAL LOW (ref 20–29)
Calcium: 9.3 mg/dL (ref 8.7–10.2)
Chloride: 102 mmol/L (ref 96–106)
Creatinine, Ser: 0.66 mg/dL (ref 0.57–1.00)
Globulin, Total: 2.3 g/dL (ref 1.5–4.5)
Sodium: 140 mmol/L (ref 134–144)
Total Protein: 7.2 g/dL (ref 6.0–8.5)
eGFR: 127 mL/min/{1.73_m2} (ref >59)

## 2022-05-29 LAB — CBC WITH DIFFERENTIAL/PLATELET
Basophils Absolute: 0 10*3/uL (ref 0.0–0.2)
Basos: 0 %
EOS (ABSOLUTE): 0.1 10*3/uL (ref 0.0–0.4)
Eos: 1 %
Hematocrit: 43.6 % (ref 34.0–46.6)
Hemoglobin: 14.9 g/dL (ref 11.1–15.9)
Immature Grans (Abs): 0 10*3/uL (ref 0.0–0.1)
Immature Granulocytes: 1 %
Lymphocytes Absolute: 2.8 10*3/uL (ref 0.7–3.1)
Lymphs: 32 %
MCH: 29.2 pg (ref 26.6–33.0)
MCHC: 34.2 g/dL (ref 31.5–35.7)
MCV: 85 fL (ref 79–97)
Monocytes Absolute: 0.5 10*3/uL (ref 0.1–0.9)
Monocytes: 6 %
Neutrophils Absolute: 5.2 10*3/uL (ref 1.4–7.0)
Neutrophils: 60 %
Platelets: 301 10*3/uL (ref 150–450)
RBC: 5.11 x10E6/uL (ref 3.77–5.28)
RDW: 12.2 % (ref 11.7–15.4)
WBC: 8.7 10*3/uL (ref 3.4–10.8)

## 2022-05-29 LAB — HEMOGLOBIN A1C
Est. average glucose Bld gHb Est-mCnc: 128 mg/dL
Hgb A1c MFr Bld: 6.1 % — ABNORMAL HIGH (ref 4.8–5.6)

## 2022-05-29 LAB — TSH: TSH: 1.36 u[IU]/mL (ref 0.450–4.500)

## 2022-06-18 ENCOUNTER — Other Ambulatory Visit: Payer: Self-pay

## 2022-06-25 ENCOUNTER — Other Ambulatory Visit: Payer: Self-pay

## 2022-06-30 ENCOUNTER — Ambulatory Visit (INDEPENDENT_AMBULATORY_CARE_PROVIDER_SITE_OTHER): Payer: Self-pay | Admitting: Gastroenterology

## 2022-06-30 DIAGNOSIS — Z23 Encounter for immunization: Secondary | ICD-10-CM

## 2022-06-30 NOTE — Progress Notes (Unsigned)
Dr Servando Snare pt, out of the office  Per orders of Dr. Servando Snare, in care of Dr Wyline Mood, injection of Twinrix Hep A/B 2 of 3 given in Right Deltoid by Roena Malady.  Patient tolerated injection well.

## 2022-07-02 ENCOUNTER — Ambulatory Visit: Payer: Medicaid Other

## 2022-07-25 IMAGING — US US ABDOMEN LIMITED
1 series · 14 of 25 positions shown · non-contrast
Comparison: 11/25/2020

CLINICAL DATA: Elevated LFTs

EXAM:
ULTRASOUND ABDOMEN LIMITED RIGHT UPPER QUADRANT

[Series 1: us abdomen limited ruq (liver/gb) · 14 of 50 slices shown]
[im 1/50]
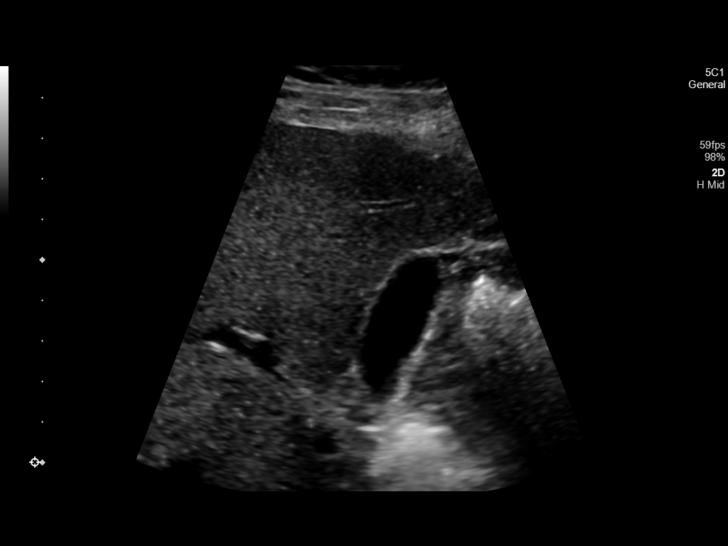
[im 5/50]
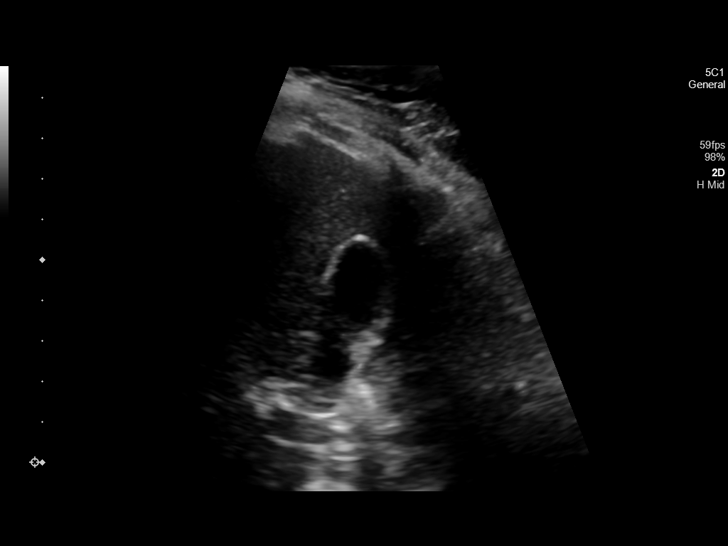
[im 9/50]
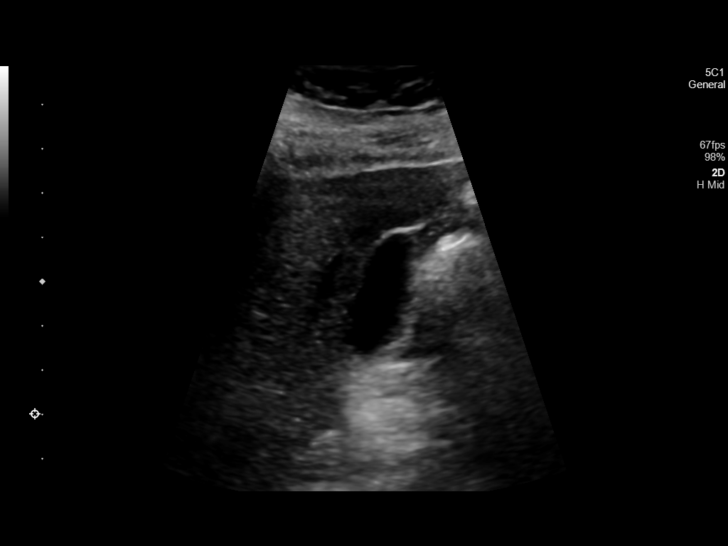
[im 13/50]
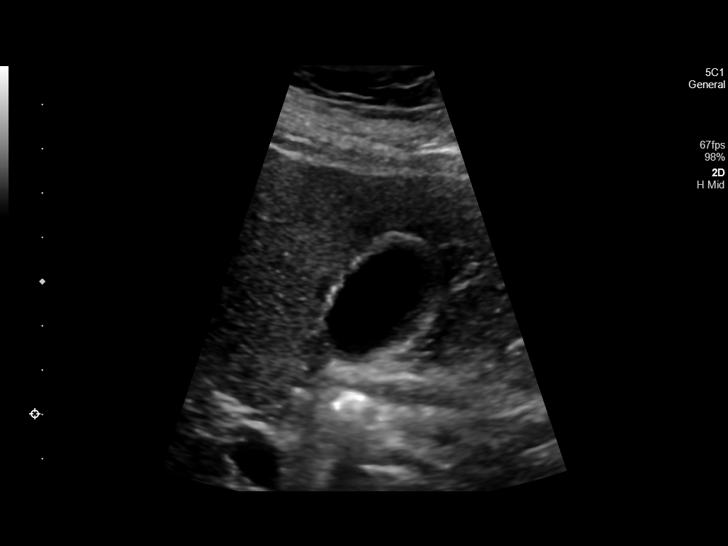
[im 17/50]
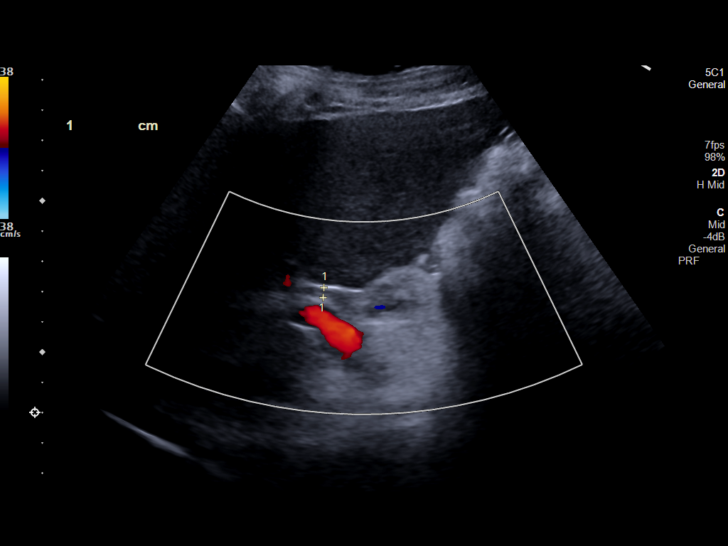
[im 19/50]
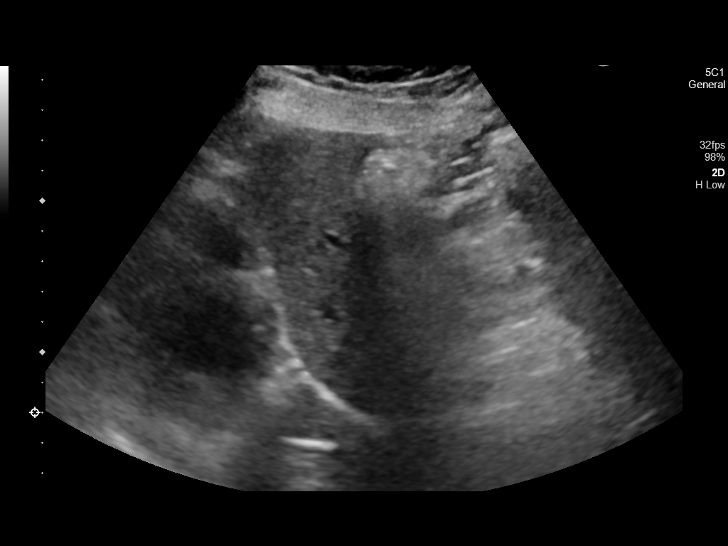
[im 23/50]
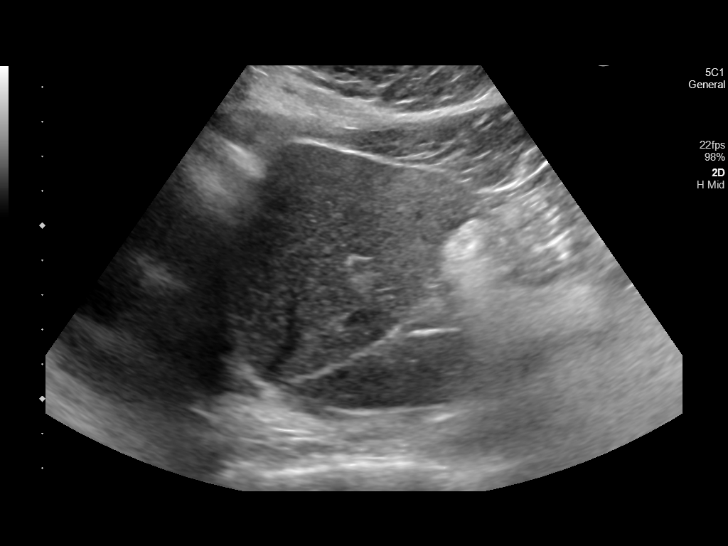
[im 27/50]
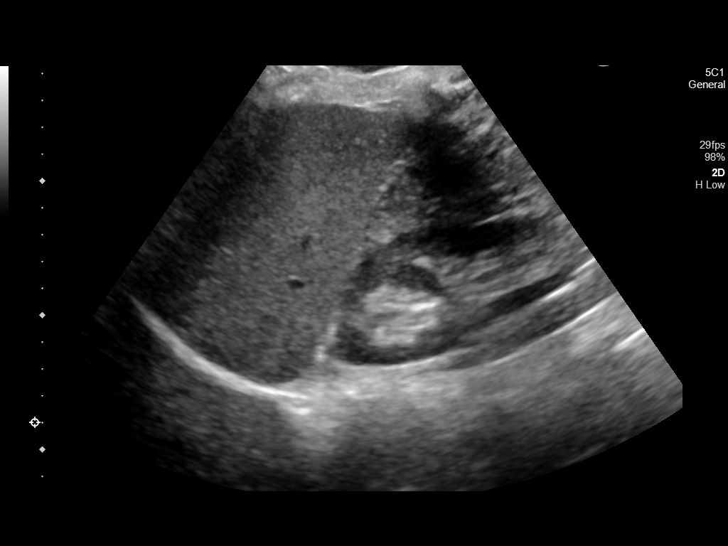
[im 31/50]
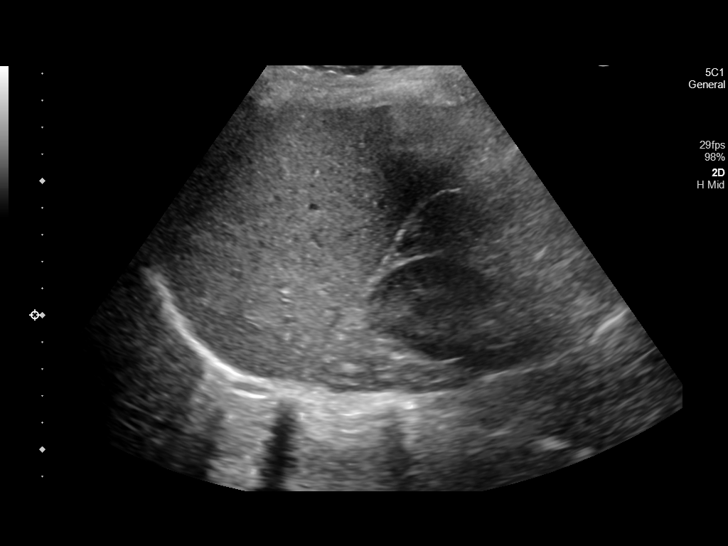
[im 33/50]
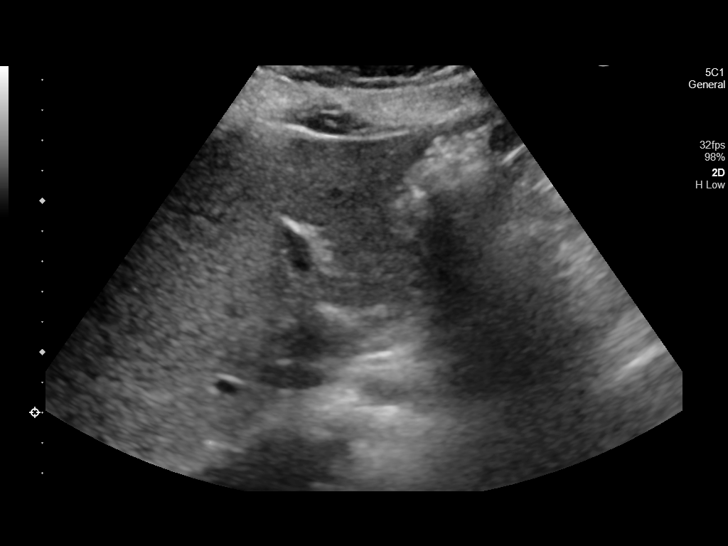
[im 37/50]
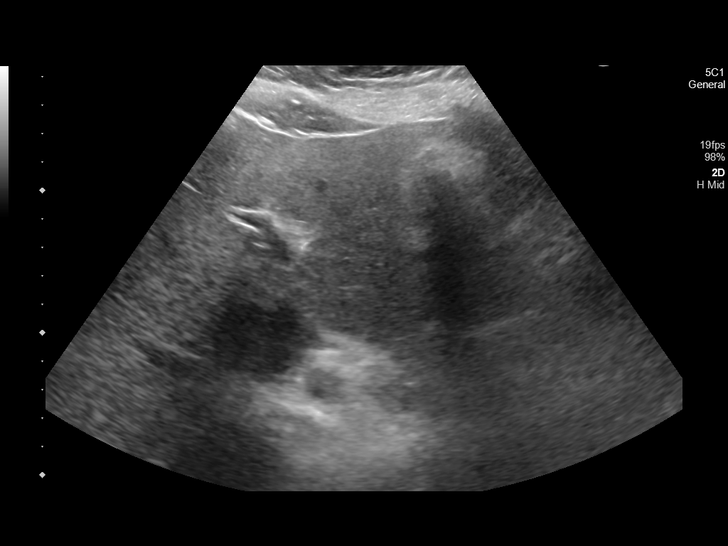
[im 41/50]
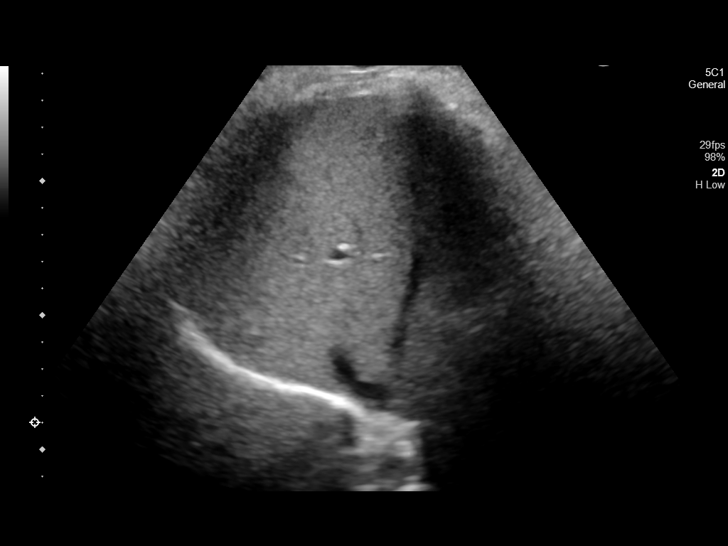
[im 45/50]
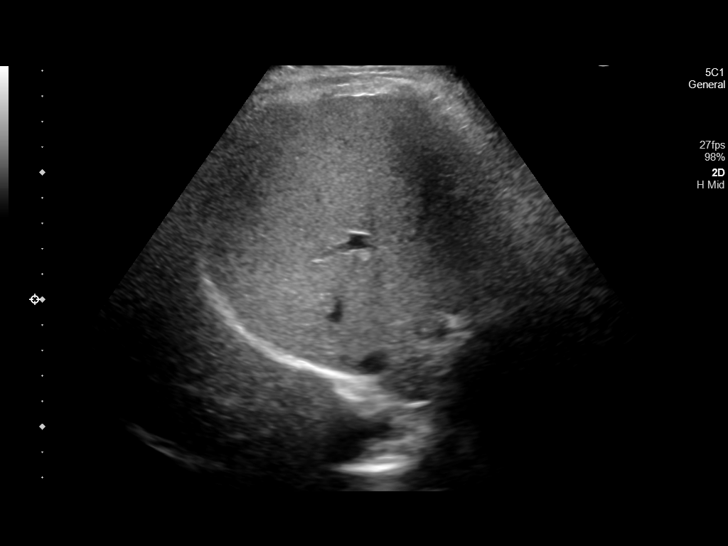
[im 50/50]
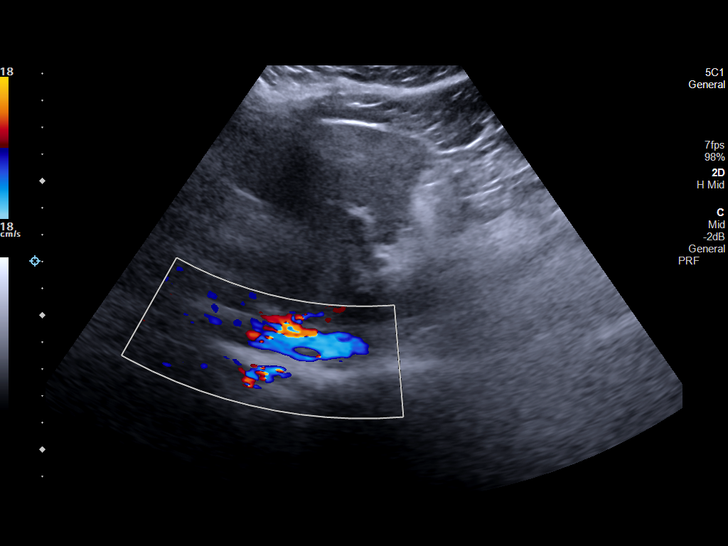

[14 of 25 positions shown; findings below may reference images not displayed]

FINDINGS: Gallbladder:

No gallstones or wall thickening visualized. No sonographic Murphy
sign noted by sonographer.

Common bile duct:

Diameter: 3 mm

Liver:

Slight increased hepatic echogenicity, nonspecific. No focal
abnormality. No ductal dilatation. No surrounding free fluid. Portal
vein is patent on color Doppler imaging with normal direction of
blood flow towards the liver.

Other: None.
IMPRESSION: No acute finding by right upper quadrant ultrasound.

Query mild hepatic steatosis.

## 2022-11-30 ENCOUNTER — Ambulatory Visit: Payer: Medicaid Other

## 2023-03-12 ENCOUNTER — Other Ambulatory Visit: Payer: Self-pay
